# Patient Record
Sex: Female | Born: 2000 | Race: White | Hispanic: No | Marital: Single | Smoking: Never smoker
Health system: Southern US, Community
[De-identification: ages and names within clinical notes are randomized; demographics above are authoritative.]

## PROBLEM LIST (undated history)

## (undated) DIAGNOSIS — F419 Anxiety disorder, unspecified: Secondary | ICD-10-CM

## (undated) DIAGNOSIS — F32A Depression, unspecified: Secondary | ICD-10-CM

## (undated) HISTORY — PX: NO PAST SURGERIES: SHX2092

---

## 2019-10-16 ENCOUNTER — Encounter (HOSPITAL_BASED_OUTPATIENT_CLINIC_OR_DEPARTMENT_OTHER): Payer: Self-pay | Admitting: Emergency Medicine

## 2019-10-16 ENCOUNTER — Emergency Department (HOSPITAL_BASED_OUTPATIENT_CLINIC_OR_DEPARTMENT_OTHER): Payer: Medicaid Other

## 2019-10-16 ENCOUNTER — Other Ambulatory Visit: Payer: Self-pay

## 2019-10-16 ENCOUNTER — Emergency Department (HOSPITAL_BASED_OUTPATIENT_CLINIC_OR_DEPARTMENT_OTHER)
Admission: EM | Admit: 2019-10-16 | Discharge: 2019-10-16 | Disposition: A | Discharge status: Home / Self Care | Payer: Medicaid Other | Attending: Emergency Medicine | Admitting: Emergency Medicine

## 2019-10-16 DIAGNOSIS — R42 Dizziness and giddiness: Secondary | ICD-10-CM | POA: Insufficient documentation

## 2019-10-16 DIAGNOSIS — Z20822 Contact with and (suspected) exposure to covid-19: Secondary | ICD-10-CM | POA: Diagnosis not present

## 2019-10-16 DIAGNOSIS — R55 Syncope and collapse: Secondary | ICD-10-CM | POA: Diagnosis not present

## 2019-10-16 DIAGNOSIS — R002 Palpitations: Secondary | ICD-10-CM | POA: Diagnosis present

## 2019-10-16 DIAGNOSIS — H539 Unspecified visual disturbance: Secondary | ICD-10-CM | POA: Diagnosis not present

## 2019-10-16 DIAGNOSIS — E860 Dehydration: Secondary | ICD-10-CM | POA: Diagnosis not present

## 2019-10-16 DIAGNOSIS — R000 Tachycardia, unspecified: Secondary | ICD-10-CM | POA: Insufficient documentation

## 2019-10-16 LAB — COMPREHENSIVE METABOLIC PANEL
ALT: 17 U/L (ref 0–44)
AST: 32 U/L (ref 15–41)
Albumin: 4.5 g/dL (ref 3.5–5.0)
Alkaline Phosphatase: 46 U/L (ref 38–126)
Anion gap: 12 (ref 5–15)
BUN: 12 mg/dL (ref 6–20)
CO2: 22 mmol/L (ref 22–32)
Calcium: 9.1 mg/dL (ref 8.9–10.3)
Chloride: 103 mmol/L (ref 98–111)
Creatinine, Ser: 0.72 mg/dL (ref 0.44–1.00)
GFR calc non Af Amer: >60 mL/min (ref >60)
Glucose, Bld: 105 mg/dL — ABNORMAL HIGH (ref 70–99)
Potassium: 3.2 mmol/L — ABNORMAL LOW (ref 3.5–5.1)
Sodium: 137 mmol/L (ref 135–145)
Total Bilirubin: 1 mg/dL (ref 0.3–1.2)
Total Protein: 7.4 g/dL (ref 6.5–8.1)

## 2019-10-16 LAB — URINALYSIS, ROUTINE W REFLEX MICROSCOPIC
Bilirubin Urine: NEGATIVE
Glucose, UA: NEGATIVE mg/dL
Hgb urine dipstick: NEGATIVE
Ketones, ur: NEGATIVE mg/dL
Leukocytes,Ua: NEGATIVE
Nitrite: NEGATIVE
Protein, ur: NEGATIVE mg/dL
Specific Gravity, Urine: 1.015 (ref 1.005–1.030)
pH: 5.5 (ref 5.0–8.0)

## 2019-10-16 LAB — RESPIRATORY PANEL BY RT PCR (FLU A&B, COVID)
Influenza A by PCR: NEGATIVE
Influenza B by PCR: NEGATIVE
SARS Coronavirus 2 by RT PCR: NEGATIVE

## 2019-10-16 LAB — CBC WITH DIFFERENTIAL/PLATELET
Abs Immature Granulocytes: 0.05 10*3/uL (ref 0.00–0.07)
Basophils Absolute: 0.1 10*3/uL (ref 0.0–0.1)
Basophils Relative: 1 %
Eosinophils Absolute: 0.3 10*3/uL (ref 0.0–0.5)
Eosinophils Relative: 2 %
HCT: 41.2 % (ref 36.0–46.0)
Hemoglobin: 14.2 g/dL (ref 12.0–15.0)
Immature Granulocytes: 0 %
Lymphocytes Relative: 51 %
Lymphs Abs: 7.2 10*3/uL — ABNORMAL HIGH (ref 0.7–4.0)
MCH: 31.9 pg (ref 26.0–34.0)
MCHC: 34.5 g/dL (ref 30.0–36.0)
MCV: 92.6 fL (ref 80.0–100.0)
Monocytes Absolute: 1 10*3/uL (ref 0.1–1.0)
Monocytes Relative: 7 %
Neutro Abs: 5.5 10*3/uL (ref 1.7–7.7)
Neutrophils Relative %: 39 %
Platelets: 300 10*3/uL (ref 150–400)
RBC: 4.45 MIL/uL (ref 3.87–5.11)
RDW: 12 % (ref 11.5–15.5)
WBC: 14.2 10*3/uL — ABNORMAL HIGH (ref 4.0–10.5)
nRBC: 0 % (ref 0.0–0.2)

## 2019-10-16 LAB — D-DIMER, QUANTITATIVE: D-Dimer, Quant: <0.27 ug/mL-FEU (ref 0.00–0.50)

## 2019-10-16 LAB — TSH: TSH: 8.718 u[IU]/mL — ABNORMAL HIGH (ref 0.350–4.500)

## 2019-10-16 LAB — CBG MONITORING, ED: Glucose-Capillary: 96 mg/dL (ref 70–99)

## 2019-10-16 LAB — PREGNANCY, URINE: Preg Test, Ur: NEGATIVE

## 2019-10-16 MED ORDER — SODIUM CHLORIDE 0.9 % IV SOLN: INTRAVENOUS | Status: DC

## 2019-10-16 MED ORDER — SODIUM CHLORIDE 0.9 % IV BOLUS
1000.0000 mL | Freq: Once | INTRAVENOUS | Status: AC
  Administered 2019-10-16: 1000 mL via INTRAVENOUS

## 2019-10-16 NOTE — ED Notes (Signed)
Pt unable to urinate at this time.  

## 2019-10-16 NOTE — ED Provider Notes (Signed)
[facility] EMERGENCY DEPARTMENT Provider Note  CSN: [id number] Arrival date & time: 10/16/19 0134  Chief Complaint(s) Palpitations and visual changes  HPI Ana Church is a 19 y.o. female with a history of depression who presents to the emergency department with palpitations. Patient reports that she was lying in bed when the symptoms began. She noted that every few beats in her heart would thump. Then she noted a rapid heart rate.  She also felt lightheaded and saw spots. Patient then began to shake all over.  Did not lose consciousness at this time. She denies any recent fevers or infections. No coughing or congestion. No associated nausea or recent vomiting.  No diarrhea or abdominal pain. Patient is sexually active with her boyfriend who brought her in. Denies any history of thyroid issues..  Wellbeing brought to the room, patient reported feeling lightheaded like she was going to pass out.  To nursing patient appeared pale and diaphoretic.  HPI  Past Medical History History reviewed. No pertinent past medical history. There are no problems to display for this patient.  Home Medication(s) Prior to Admission medications   Not on File                                                                                                                                    Past Surgical History History reviewed. No pertinent surgical history. Family History Family History  Problem Relation Age of Onset  . Hypertension Father   . Diabetes Father     Social History Social History   Tobacco Use  . Smoking status: Never Smoker  . Smokeless tobacco: Never Used  Vaping Use  . Vaping Use: Never used  Substance Use Topics  . Alcohol use: Never  . Drug use: Never   Allergies Patient has no known allergies.  Review of Systems Review of Systems All other systems are reviewed and are negative for acute change except as noted in the HPI  Physical Exam Vital Signs    I have reviewed the triage vital signs BP 117/78 (BP Location: Right Arm)   Pulse (!) 103   Temp 98.2 F (36.8 C) (Oral)   Resp 18   Ht 5\' 4"  (1.626 m)   Wt 59 kg   LMP 10/09/2019 (Approximate)   SpO2 100%   BMI 22.31 kg/m   Physical Exam Vitals reviewed.  Constitutional:      General: She is not in acute distress.    Appearance: She is well-developed. She is not diaphoretic.  HENT:     Head: Normocephalic and atraumatic.     Nose: Nose normal.  Eyes:     General: No scleral icterus.       Right eye: No discharge.        Left eye: No discharge.     Conjunctiva/sclera: Conjunctivae normal.     Pupils: Pupils are equal, round, and reactive to light.  Cardiovascular:  Rate and Rhythm: Regular rhythm. Tachycardia present.     Heart sounds: No murmur heard.  No friction rub. No gallop.   Pulmonary:     Effort: Pulmonary effort is normal. No respiratory distress.     Breath sounds: Normal breath sounds. No stridor. No rales.  Abdominal:     General: There is no distension.     Palpations: Abdomen is soft.     Tenderness: There is no abdominal tenderness.  Musculoskeletal:        General: No tenderness.     Cervical back: Normal range of motion and neck supple.  Skin:    General: Skin is warm and dry.     Findings: No erythema or rash.  Neurological:     Mental Status: She is alert and oriented to person, place, and time.     ED Results and Treatments Labs (all labs ordered are listed, but only abnormal results are displayed) Labs Reviewed  CBC WITH DIFFERENTIAL/PLATELET - Abnormal; Notable for the following components:      Result Value   WBC 14.2 (*)    Lymphs Abs 7.2 (*)    All other components within normal limits  COMPREHENSIVE METABOLIC PANEL - Abnormal; Notable for the following components:   Potassium 3.2 (*)    Glucose, Bld 105 (*)    All other components within normal limits  TSH - Abnormal; Notable for the following components:   TSH 8.718 (*)     All other components within normal limits  RESPIRATORY PANEL BY RT PCR (FLU A&B, COVID)  PREGNANCY, URINE  URINALYSIS, ROUTINE W REFLEX MICROSCOPIC  D-DIMER, QUANTITATIVE (NOT AT [facility])  T4, FREE  PATHOLOGIST SMEAR REVIEW  CBG MONITORING, ED  CBG MONITORING, ED                                                                                                                         EKG  EKG Interpretation  Date/Time:  Wednesday October 16 2019 07:17:26 EDT Ventricular Rate:  97 PR Interval:    QRS Duration: 106 QT Interval:  359 QTC Calculation: 456 R Axis:   101 Text Interpretation: Sinus rhythm Borderline right axis deviation Low voltage, extremity and precordial leads When compared to prior, improved QTc. No STEMI Confirmed by Theda Belfast ([id number]) on 10/16/2019 9:40:44 AM      Radiology DG Chest 2 View  Result Date: 10/16/2019 CLINICAL DATA:  Tachycardia EXAM: CHEST - 2 VIEW COMPARISON:  None. FINDINGS: The heart size and mediastinal contours are within normal limits. Both lungs are clear. The visualized skeletal structures are unremarkable. IMPRESSION: No active cardiopulmonary disease. Electronically Signed   By: Jonna Clark M.D.   On: 10/16/2019 02:47    Pertinent labs & imaging results that were available during my care of the patient were reviewed by me and considered in my medical decision making (see chart for details).  Medications Ordered in ED Medications  sodium chloride 0.9 % bolus 1,000 mL (0 mLs  Intravenous Stopped 10/16/19 0340)    And  0.9 %  sodium chloride infusion ( Intravenous Stopped 10/16/19 1606)  sodium chloride 0.9 % bolus 1,000 mL (0 mLs Intravenous Stopped 10/16/19 0852)                                                                                                                                    Procedures Procedures  (including critical care time)  Medical Decision Making / ED Course I have reviewed the nursing notes for this encounter and  the patient's prior records (if available in EHR or on provided paperwork).   Ana Church was evaluated in Emergency Department on 10/16/2019 for the symptoms described in the history of present illness. She was evaluated in the context of the global COVID-19 pandemic, which necessitated consideration that the patient might be at risk for infection with the SARS-CoV-2 virus that causes COVID-19. Institutional protocols and algorithms that pertain to the evaluation of patients at risk for COVID-19 are in a state of rapid change based on information released by regulatory bodies including the CDC and federal and state organizations. These policies and algorithms were followed during the patient's care in the ED.  Patient presents with palpitations and near syncopal episode. EKG with sinus tachycardia and borderline QT prolongation.  Patient denies being on any medications.  Denies any illicit drug use or alcohol use. Patient is orthostatic with elevated heart rate upon standing. Will provide IV fluids. UPT negative.  UA without evidence of infection. CBC with leukocytosis and elevated lymphocyte counts which morphologically appear to be normal/reactive. No symptoms concerning for viral process. No evidence of infection on exam. Covid/influenza negative.  Metabolic panel with mild hypokalemia otherwise no significant electrolyte derangements or renal sufficiency.  Check in thyroid panel to assess for possible hyperthyroidism.  Still pending.  Dimer obtained to rule out PE and negative.  PE unlikely.  After liter half of IV fluids, patient was reassessed and still had tachycardia to the 130s upon standing.  Will provide with additional fluid hydration.  Patient care turned over to Dr Rush Landmark. Patient case and results discussed in detail; please see their note for further ED managment.      Final Clinical Impression(s) / ED Diagnoses Final diagnoses:  Palpitations  Intermittent  lightheadedness  Tachycardia  Dehydration      This chart was dictated using voice recognition software.  Despite best efforts to proofread,  errors can occur which can change the documentation meaning.   Nira Conn, MD 10/16/19 2107

## 2019-10-16 NOTE — ED Notes (Signed)
ED Provider at bedside. 

## 2019-10-16 NOTE — ED Triage Notes (Signed)
Pt brought in by her boyfriend  Pt states she was asleep and woke up with her heart racing and states she started shaking all over  Pt states on the way here she felt like she was going to pass out and became diaphoretic  Pt alert and oriented in triage   Pt is diaphoretic and pale

## 2019-10-16 NOTE — ED Notes (Signed)
Reviewed D/C papers, pt denies questions at this time.

## 2019-10-16 NOTE — Discharge Instructions (Signed)
Your work-up today was overall reassuring.  Your TSH was slightly elevated discussed, please follow-up with your PCP for this.  For the recurrent palpitations and fast heart rate, if it does not continue to improve, please follow-up with cardiology for further evaluation and management.  Your other work-up is reassuring and we do not see evidence of a blood clot, pneumonia, your Covid test was negative, and your other labs are reassuring.  Please rest and stay hydrated.  If any symptoms change or worsen, please return to the nearest emergency department.

## 2019-10-16 NOTE — ED Provider Notes (Signed)
7:26 AM Care assumed for Dr. Eudelia Bunch.  At time of transfer of care, patient is awaiting reassessment after further rehydration and checking of orthostatics.  Patient had a syncopal episode as well as palpitations and tachycardia likely related to dehydration.  Patient had work-up showing no evidence of infection in her urine or chest x-ray.  Covid test has been ordered.  Patient is a mild leukocytosis but otherwise did not have significant anemia or elevated D-dimer.  Patient is scheduled to follow-up with her PCP soon.  Plan of care with a discharge if she is feeling better and heart rate improves after fluids.  Patient received fluids and still had some intermittent tachycardia with standing.  However she had no further lightheadedness, palpitations or other symptoms.  She denies any syncope.  She was not hypotensive further.  Her work-up still was reassuring with no evidence of UTI, Covid, or PE with lack of elevation in D-dimer.  TSH was low elevated suggestive of a hypothyroidism instead of hyperthyroidism.  Given the fast heart rate with palpitations this was felt to be less likely.  The T4 still in process and she will follow up with her PCP for recheck and further evaluation of her thyroid.  We offered further fluids and continued reassessment orthostatics after more fluids however she would rather go home.  Her EKG did not show A. fib on reassessment.  QTc had improved.  Given otherwise well appearance and improved symptoms after rehydration, feel she safe for discharge home for PCP follow-up this week.  She agrees with plan of care and was discharged in good condition after p.o. challenge.  Clinical Impression: 1. Palpitations   2. Intermittent lightheadedness   3. Tachycardia   4. Dehydration     Disposition: Discharge  Condition: Good  I have discussed the results, Dx and Tx plan with the pt(& family if present). He/she/they expressed understanding and agree(s) with the plan.  Discharge instructions discussed at great length. Strict return precautions discussed and pt &/or family have verbalized understanding of the instructions. No further questions at time of discharge.    New Prescriptions   No medications on file    Follow Up: [facility] [address] [place] [address] [phone] Schedule an appointment as soon as possible for a visit    [facility] [address] [phone] mc [address] [place] [address] [phone]    [facility] CARDIOVASCULAR DIVISION [address] [place] [address] [phone]           Sayvion Vigen, Canary Brim, MD 10/16/19 1540

## 2019-10-17 LAB — T4, FREE: Free T4: 0.97 ng/dL (ref 0.61–1.12)

## 2019-10-17 LAB — PATHOLOGIST SMEAR REVIEW

## 2020-11-10 ENCOUNTER — Other Ambulatory Visit: Payer: Self-pay

## 2020-11-10 ENCOUNTER — Encounter (HOSPITAL_BASED_OUTPATIENT_CLINIC_OR_DEPARTMENT_OTHER): Payer: Self-pay | Admitting: *Deleted

## 2020-11-10 ENCOUNTER — Emergency Department (HOSPITAL_BASED_OUTPATIENT_CLINIC_OR_DEPARTMENT_OTHER)
Admission: EM | Admit: 2020-11-10 | Discharge: 2020-11-10 | Disposition: A | Discharge status: Home / Self Care | Payer: Medicaid Other | Attending: Emergency Medicine | Admitting: Emergency Medicine

## 2020-11-10 DIAGNOSIS — Z5321 Procedure and treatment not carried out due to patient leaving prior to being seen by health care provider: Secondary | ICD-10-CM | POA: Diagnosis not present

## 2020-11-10 DIAGNOSIS — R202 Paresthesia of skin: Secondary | ICD-10-CM | POA: Diagnosis not present

## 2020-11-10 DIAGNOSIS — R42 Dizziness and giddiness: Secondary | ICD-10-CM | POA: Insufficient documentation

## 2020-11-10 DIAGNOSIS — R002 Palpitations: Secondary | ICD-10-CM | POA: Insufficient documentation

## 2020-11-10 DIAGNOSIS — R519 Headache, unspecified: Secondary | ICD-10-CM | POA: Diagnosis not present

## 2020-11-10 HISTORY — DX: Depression, unspecified: F32.A

## 2020-11-10 HISTORY — DX: Anxiety disorder, unspecified: F41.9

## 2020-11-10 NOTE — ED Triage Notes (Signed)
She woke with feeling of her heart being forced to beat. Palpitations that are painful. Through out the day she has had dizziness, tingling, head pressure. EKG at triage.

## 2021-03-09 ENCOUNTER — Other Ambulatory Visit: Payer: Self-pay

## 2021-03-09 ENCOUNTER — Emergency Department (HOSPITAL_BASED_OUTPATIENT_CLINIC_OR_DEPARTMENT_OTHER)
Admission: EM | Admit: 2021-03-09 | Discharge: 2021-03-09 | Disposition: A | Discharge status: Home / Self Care | Payer: Medicaid Other | Attending: Emergency Medicine | Admitting: Emergency Medicine

## 2021-03-09 ENCOUNTER — Encounter (HOSPITAL_BASED_OUTPATIENT_CLINIC_OR_DEPARTMENT_OTHER): Payer: Self-pay

## 2021-03-09 DIAGNOSIS — E871 Hypo-osmolality and hyponatremia: Secondary | ICD-10-CM | POA: Insufficient documentation

## 2021-03-09 DIAGNOSIS — R112 Nausea with vomiting, unspecified: Secondary | ICD-10-CM | POA: Insufficient documentation

## 2021-03-09 DIAGNOSIS — Z20822 Contact with and (suspected) exposure to covid-19: Secondary | ICD-10-CM | POA: Diagnosis not present

## 2021-03-09 DIAGNOSIS — R1033 Periumbilical pain: Secondary | ICD-10-CM | POA: Insufficient documentation

## 2021-03-09 DIAGNOSIS — R197 Diarrhea, unspecified: Secondary | ICD-10-CM | POA: Insufficient documentation

## 2021-03-09 DIAGNOSIS — K529 Noninfective gastroenteritis and colitis, unspecified: Secondary | ICD-10-CM

## 2021-03-09 LAB — CBC
HCT: 44.6 % (ref 36.0–46.0)
Hemoglobin: 15.3 g/dL — ABNORMAL HIGH (ref 12.0–15.0)
MCH: 32.6 pg (ref 26.0–34.0)
MCHC: 34.3 g/dL (ref 30.0–36.0)
MCV: 94.9 fL (ref 80.0–100.0)
Platelets: 236 10*3/uL (ref 150–400)
RBC: 4.7 MIL/uL (ref 3.87–5.11)
RDW: 12.3 % (ref 11.5–15.5)
WBC: 11.3 10*3/uL — ABNORMAL HIGH (ref 4.0–10.5)
nRBC: 0 % (ref 0.0–0.2)

## 2021-03-09 LAB — URINALYSIS, ROUTINE W REFLEX MICROSCOPIC
Bilirubin Urine: NEGATIVE
Glucose, UA: NEGATIVE mg/dL
Ketones, ur: NEGATIVE mg/dL
Leukocytes,Ua: NEGATIVE
Nitrite: NEGATIVE
Protein, ur: NEGATIVE mg/dL
Specific Gravity, Urine: >=1.03 (ref 1.005–1.030)
pH: 5.5 (ref 5.0–8.0)

## 2021-03-09 LAB — COMPREHENSIVE METABOLIC PANEL
ALT: 17 U/L (ref 0–44)
AST: 32 U/L (ref 15–41)
Albumin: 4.3 g/dL (ref 3.5–5.0)
Alkaline Phosphatase: 55 U/L (ref 38–126)
Anion gap: 9 (ref 5–15)
BUN: 8 mg/dL (ref 6–20)
CO2: 24 mmol/L (ref 22–32)
Calcium: 8.8 mg/dL — ABNORMAL LOW (ref 8.9–10.3)
Chloride: 100 mmol/L (ref 98–111)
Creatinine, Ser: 0.74 mg/dL (ref 0.44–1.00)
GFR, Estimated: >60 mL/min (ref >60)
Glucose, Bld: 108 mg/dL — ABNORMAL HIGH (ref 70–99)
Potassium: 3.2 mmol/L — ABNORMAL LOW (ref 3.5–5.1)
Sodium: 133 mmol/L — ABNORMAL LOW (ref 135–145)
Total Bilirubin: 0.9 mg/dL (ref 0.3–1.2)
Total Protein: 8 g/dL (ref 6.5–8.1)

## 2021-03-09 LAB — URINALYSIS, MICROSCOPIC (REFLEX): WBC, UA: NONE SEEN WBC/hpf (ref 0–5)

## 2021-03-09 LAB — RESP PANEL BY RT-PCR (FLU A&B, COVID) ARPGX2
Influenza A by PCR: NEGATIVE
Influenza B by PCR: NEGATIVE
SARS Coronavirus 2 by RT PCR: NEGATIVE

## 2021-03-09 LAB — LIPASE, BLOOD: Lipase: 38 U/L (ref 11–51)

## 2021-03-09 LAB — PREGNANCY, URINE: Preg Test, Ur: NEGATIVE

## 2021-03-09 MED ORDER — POTASSIUM CHLORIDE 20 MEQ PO PACK: 40.0000 meq | PACK | Freq: Two times a day (BID) | ORAL | Status: DC

## 2021-03-09 MED ORDER — ONDANSETRON 4 MG PO TBDP: 4.0000 mg | ORAL_TABLET | Freq: Three times a day (TID) | ORAL | 0 refills | Status: DC | PRN

## 2021-03-09 MED ORDER — POTASSIUM CHLORIDE CRYS ER 20 MEQ PO TBCR: 40.0000 meq | EXTENDED_RELEASE_TABLET | Freq: Once | ORAL | Status: DC

## 2021-03-09 MED ORDER — ONDANSETRON 4 MG PO TBDP
8.0000 mg | ORAL_TABLET | Freq: Once | ORAL | Status: AC
  Administered 2021-03-09: 8 mg via ORAL
  Filled 2021-03-09: qty 2

## 2021-03-09 MED ORDER — SODIUM CHLORIDE 0.9 % IV BOLUS
1000.0000 mL | Freq: Once | INTRAVENOUS | Status: AC
  Administered 2021-03-09: 1000 mL via INTRAVENOUS

## 2021-03-09 NOTE — Discharge Instructions (Addendum)
Stay well hydrated, take frequent sips- every 15 minutes. You may use zofran for nausea and vomiting once every 8 hours. I recommend using drinks like pedialyte or gatorade that have electrolytes in them to stay hydrated.   If you have a fever above 100.4*F that is not responsive to treatment like tylenol, you cannot keep fluids down even with zofran, you have terrible abdominal pain that does not improve, please return to be evaluated.  Follow up in a few days with your primary care doctor if not improved.

## 2021-03-09 NOTE — ED Notes (Signed)
Pt A&OX4 ambulatory at d/c with independent steady gait, NAD. Pt verbalized understanding of d/c instructions, prescription and follow up care. 

## 2021-03-09 NOTE — ED Provider Notes (Signed)
[facility] EMERGENCY DEPARTMENT Provider Note   CSN: [id number] Arrival date & time: 03/09/21  1508     History  Chief Complaint  Patient presents with   Abdominal Pain    Ana Church is a [age] y.o. female.  21 yo woman presents with 36 hours of nausea, emesis, bloody diarrhea, body aches, abdominal pain and mildly elevated temperature. She reports yesterday she started feeling poorly with body aches and chills, and developed n/v/d. She had an elevated temp to 99.9*F, no true fever. She had a couple episodes of emesis yesterday (none today), 4 episodes of diarrhea today, and is able to tolerate fluids. She reports that the diarrhea had bright red blood mixed in, no clots. Her abdominal pain is dull, achy, and periumbilical. She went to an urgent care and they recommended she come to the ED. Her PMH is only notable for SVT and anxiety, she takes fluoxetine. She has never had abdominal surgery. No family hx of IBD.      Home Medications Prior to Admission medications   Medication Sig Start Date End Date Taking? Authorizing Provider  FLUoxetine (PROZAC) 10 MG capsule Take by mouth.    [provider]      Allergies    Patient has no known allergies.    Review of Systems   Review of Systems  Constitutional:  Positive for chills. Negative for fever.  HENT:  Negative for rhinorrhea, sinus pain and sore throat.   Respiratory:  Negative for cough, shortness of breath and wheezing.   Cardiovascular:  Negative for chest pain, palpitations and leg swelling.  Gastrointestinal:  Positive for abdominal pain, diarrhea, nausea and vomiting. Negative for constipation.  Genitourinary:  Negative for dysuria, flank pain, frequency and urgency.  Musculoskeletal:  Positive for myalgias. Negative for back pain.  Skin:  Positive for pallor. Negative for color change.  Neurological:  Negative for dizziness, weakness and headaches.  Psychiatric/Behavioral:  Negative for  confusion.    Physical Exam Updated Vital Signs BP 110/72    Pulse (!) 110    Temp 98.1 F (36.7 C) (Oral)    Resp (!) 21    Ht 5\' 4"  (1.626 m)    Wt 61.7 kg    LMP 02/25/2021 (Approximate)    SpO2 99%    BMI 23.34 kg/m  Physical Exam Vitals and nursing note reviewed.  Constitutional:      General: She is not in acute distress.    Appearance: She is normal weight. She is ill-appearing. She is not toxic-appearing or diaphoretic.  HENT:     Head: Normocephalic and atraumatic.     Mouth/Throat:     Mouth: Mucous membranes are moist.     Pharynx: Oropharynx is clear.  Eyes:     General: No scleral icterus.    Pupils: Pupils are equal, round, and reactive to light.  Cardiovascular:     Rate and Rhythm: Regular rhythm. Tachycardia present.     Heart sounds: Normal heart sounds. No murmur heard.   No friction rub. No gallop.  Pulmonary:     Effort: Pulmonary effort is normal.     Breath sounds: Normal breath sounds.  Abdominal:     General: Abdomen is flat. Bowel sounds are normal. There is no distension or abdominal bruit. There are no signs of injury.     Palpations: Abdomen is soft. There is no hepatomegaly, splenomegaly or mass.     Tenderness: There is abdominal tenderness in the periumbilical area. There is  no right CVA tenderness, left CVA tenderness, guarding or rebound. Negative signs include McBurney's sign.     Hernia: No hernia is present.  Skin:    General: Skin is warm and dry.     Capillary Refill: Capillary refill takes less than 2 seconds.     Coloration: Skin is pale.  Neurological:     General: No focal deficit present.     Mental Status: She is alert and oriented to person, place, and time.  Psychiatric:        Mood and Affect: Mood normal.        Behavior: Behavior normal.    ED Results / Procedures / Treatments   Labs (all labs ordered are listed, but only abnormal results are displayed) Labs Reviewed  CBC - Abnormal; Notable for the following  components:      Result Value   WBC 11.3 (*)    Hemoglobin 15.3 (*)    All other components within normal limits  RESP PANEL BY RT-PCR (FLU A&B, COVID) ARPGX2  PREGNANCY, URINE  LIPASE, BLOOD  COMPREHENSIVE METABOLIC PANEL  URINALYSIS, ROUTINE W REFLEX MICROSCOPIC    EKG EKG Interpretation  Date/Time:  Tuesday March 09 2021 15:50:25 EST Ventricular Rate:  106 PR Interval:  141 QRS Duration: 109 QT Interval:  340 QTC Calculation: 452 R Axis:   117 Text Interpretation: Sinus tachycardia Right axis deviation Low voltage, precordial leads No significant change since last tracing Confirmed by Gareth Morgan [phone]) on 03/09/2021 3:53:32 PM  Radiology No results found.  Procedures Procedures    Medications Ordered in ED Medications  ondansetron (ZOFRAN-ODT) disintegrating tablet 8 mg (8 mg Oral Given 03/09/21 1604)  sodium chloride 0.9 % bolus 1,000 mL (1,000 mLs Intravenous New Bag/Given 03/09/21 1602)    ED Course/ Medical Decision Making/ A&P Clinical Course as of 03/09/21 1722  Tue Mar 09, 2021  1634 Potassium low to 3.2, will replete with 40 mEq klor con packet. Mild hyponatremia to 133, will treat with NS bolus.  [CM]  1709 Tachycardia resolved with fluids.  [CM]  1709 Some hematuria, though likely contaminant. Patient's menses ended 1.5 weeks ago. Discussed that this is most likely a gastroenteritis and recommend supportive care. Patient was agreeable to this plan. As she is young, otherwise healthy, does not have severe dehydration, <6 stools per day and hgb stable, recommend not treating with abx. Will send home with zofran, strict return precautions, follow up with primary care physician. [CM]    Clinical Course User Index [CM] Ana Damme, MD                           Medical Decision Making 21 yo woman presents with 36 hours of abdominal pain with infectious symptoms. Ddx broad, but most likely to be gastroenteritis given abdominal pain, n/v/d. Also on  ddx is IBD, pancreatitis, appendicitis, nephrolithiasis, ovarian torsion or PID, however given benign abdominal exam, no urinary sx, no pelvic pain, these are all less likely. Vital signs are only notable for mild tachycardia to 110s. Will obtain basic CBC, CMP, lipase, ua, ekg, urine pregnancy test. Will treat with zofran, IVF and re-evaluate.  Amount and/or Complexity of Data Reviewed Labs: ordered. ECG/medicine tests: independent interpretation performed.    Details: sinus tachycardia  Risk Prescription drug management.            Final Clinical Impression(s) / ED Diagnoses Final diagnoses:  None    Rx / DC Orders ED  Discharge Orders     None         Ana Damme, MD 03/09/21 [id number]    Gareth Morgan, MD 03/10/21 1444

## 2021-03-09 NOTE — ED Triage Notes (Signed)
Pt c/o abd pain, n/v, bloody diarrhea started yesterday-NAD-steady gait

## 2021-11-18 ENCOUNTER — Encounter (HOSPITAL_COMMUNITY): Payer: Self-pay | Admitting: Nurse Practitioner

## 2021-11-18 ENCOUNTER — Emergency Department (HOSPITAL_COMMUNITY)
Admission: EM | Admit: 2021-11-18 | Discharge: 2021-11-18 | Disposition: A | Discharge status: Other Acute Inpatient Hospital | Payer: Medicaid Other | Source: Home / Self Care | Attending: Emergency Medicine | Admitting: Emergency Medicine

## 2021-11-18 ENCOUNTER — Other Ambulatory Visit: Payer: Self-pay

## 2021-11-18 ENCOUNTER — Inpatient Hospital Stay (HOSPITAL_COMMUNITY)
Admission: AD | Admit: 2021-11-18 | Discharge: 2021-11-21 | DRG: 885 | Disposition: A | Discharge status: Home / Self Care | Payer: Medicaid Other | Source: Intra-hospital | Attending: Psychiatry | Admitting: Psychiatry

## 2021-11-18 ENCOUNTER — Encounter (HOSPITAL_COMMUNITY): Payer: Self-pay | Admitting: *Deleted

## 2021-11-18 DIAGNOSIS — Z1152 Encounter for screening for COVID-19: Secondary | ICD-10-CM

## 2021-11-18 DIAGNOSIS — Z20822 Contact with and (suspected) exposure to covid-19: Secondary | ICD-10-CM | POA: Insufficient documentation

## 2021-11-18 DIAGNOSIS — F1090 Alcohol use, unspecified, uncomplicated: Secondary | ICD-10-CM | POA: Diagnosis present

## 2021-11-18 DIAGNOSIS — F419 Anxiety disorder, unspecified: Secondary | ICD-10-CM | POA: Diagnosis present

## 2021-11-18 DIAGNOSIS — R45851 Suicidal ideations: Secondary | ICD-10-CM | POA: Diagnosis present

## 2021-11-18 DIAGNOSIS — Y905 Blood alcohol level of 100-119 mg/100 ml: Secondary | ICD-10-CM | POA: Insufficient documentation

## 2021-11-18 DIAGNOSIS — F332 Major depressive disorder, recurrent severe without psychotic features: Secondary | ICD-10-CM | POA: Insufficient documentation

## 2021-11-18 DIAGNOSIS — J029 Acute pharyngitis, unspecified: Secondary | ICD-10-CM | POA: Diagnosis present

## 2021-11-18 DIAGNOSIS — F1010 Alcohol abuse, uncomplicated: Secondary | ICD-10-CM | POA: Diagnosis not present

## 2021-11-18 DIAGNOSIS — E559 Vitamin D deficiency, unspecified: Secondary | ICD-10-CM | POA: Diagnosis present

## 2021-11-18 LAB — COMPREHENSIVE METABOLIC PANEL
ALT: 18 U/L (ref 0–44)
AST: 37 U/L (ref 15–41)
Albumin: 4.2 g/dL (ref 3.5–5.0)
Alkaline Phosphatase: 51 U/L (ref 38–126)
Anion gap: 8 (ref 5–15)
BUN: 10 mg/dL (ref 6–20)
CO2: 23 mmol/L (ref 22–32)
Calcium: 8.9 mg/dL (ref 8.9–10.3)
Chloride: 108 mmol/L (ref 98–111)
Creatinine, Ser: 0.59 mg/dL (ref 0.44–1.00)
GFR, Estimated: >60 mL/min (ref >60)
Glucose, Bld: 90 mg/dL (ref 70–99)
Potassium: 3.8 mmol/L (ref 3.5–5.1)
Sodium: 139 mmol/L (ref 135–145)
Total Bilirubin: 0.6 mg/dL (ref 0.3–1.2)
Total Protein: 7.5 g/dL (ref 6.5–8.1)

## 2021-11-18 LAB — RAPID URINE DRUG SCREEN, HOSP PERFORMED
Amphetamines: NOT DETECTED
Barbiturates: NOT DETECTED
Benzodiazepines: NOT DETECTED
Cocaine: NOT DETECTED
Opiates: NOT DETECTED
Tetrahydrocannabinol: NOT DETECTED

## 2021-11-18 LAB — RESP PANEL BY RT-PCR (FLU A&B, COVID) ARPGX2
Influenza A by PCR: NEGATIVE
Influenza B by PCR: NEGATIVE
SARS Coronavirus 2 by RT PCR: NEGATIVE

## 2021-11-18 LAB — CBC WITH DIFFERENTIAL/PLATELET
Abs Immature Granulocytes: 0.02 10*3/uL (ref 0.00–0.07)
Basophils Absolute: 0.1 10*3/uL (ref 0.0–0.1)
Basophils Relative: 1 %
Eosinophils Absolute: 0.1 10*3/uL (ref 0.0–0.5)
Eosinophils Relative: 2 %
HCT: 38.4 % (ref 36.0–46.0)
Hemoglobin: 13 g/dL (ref 12.0–15.0)
Immature Granulocytes: 0 %
Lymphocytes Relative: 46 %
Lymphs Abs: 3.8 10*3/uL (ref 0.7–4.0)
MCH: 32.7 pg (ref 26.0–34.0)
MCHC: 33.9 g/dL (ref 30.0–36.0)
MCV: 96.7 fL (ref 80.0–100.0)
Monocytes Absolute: 0.6 10*3/uL (ref 0.1–1.0)
Monocytes Relative: 7 %
Neutro Abs: 3.6 10*3/uL (ref 1.7–7.7)
Neutrophils Relative %: 44 %
Platelets: 276 10*3/uL (ref 150–400)
RBC: 3.97 MIL/uL (ref 3.87–5.11)
RDW: 12.3 % (ref 11.5–15.5)
WBC: 8.2 10*3/uL (ref 4.0–10.5)
nRBC: 0 % (ref 0.0–0.2)

## 2021-11-18 LAB — I-STAT BETA HCG BLOOD, ED (MC, WL, AP ONLY): I-stat hCG, quantitative: <5 m[IU]/mL (ref <5)

## 2021-11-18 LAB — ETHANOL: Alcohol, Ethyl (B): 100 mg/dL — ABNORMAL HIGH (ref <10)

## 2021-11-18 LAB — SALICYLATE LEVEL: Salicylate Lvl: <7 mg/dL — ABNORMAL LOW (ref 7.0–30.0)

## 2021-11-18 LAB — ACETAMINOPHEN LEVEL: Acetaminophen (Tylenol), Serum: <10 ug/mL — ABNORMAL LOW (ref 10–30)

## 2021-11-18 MED ORDER — FLUOXETINE HCL 10 MG PO CAPS
10.0000 mg | ORAL_CAPSULE | Freq: Every day | ORAL | Status: DC
  Administered 2021-11-18: 10 mg via ORAL
  Filled 2021-11-18: qty 1

## 2021-11-18 MED ORDER — MAGNESIUM HYDROXIDE 400 MG/5ML PO SUSP: 30.0000 mL | Freq: Every day | ORAL | Status: DC | PRN

## 2021-11-18 MED ORDER — ACETAMINOPHEN 325 MG PO TABS
650.0000 mg | ORAL_TABLET | Freq: Four times a day (QID) | ORAL | Status: DC | PRN
  Administered 2021-11-19: 650 mg via ORAL
  Filled 2021-11-18: qty 2

## 2021-11-18 MED ORDER — FLUOXETINE HCL 10 MG PO CAPS
10.0000 mg | ORAL_CAPSULE | Freq: Every day | ORAL | Status: DC
  Administered 2021-11-19: 10 mg via ORAL
  Filled 2021-11-18 (×2): qty 1

## 2021-11-18 MED ORDER — HYDROXYZINE HCL 25 MG PO TABS: 25.0000 mg | ORAL_TABLET | Freq: Three times a day (TID) | ORAL | Status: DC | PRN

## 2021-11-18 MED ORDER — ALUM & MAG HYDROXIDE-SIMETH 200-200-20 MG/5ML PO SUSP: 30.0000 mL | ORAL | Status: DC | PRN

## 2021-11-18 MED ORDER — TRAZODONE HCL 50 MG PO TABS
50.0000 mg | ORAL_TABLET | Freq: Every evening | ORAL | Status: DC | PRN
  Filled 2021-11-18: qty 1

## 2021-11-18 NOTE — Progress Notes (Signed)
Adult Psychoeducational Group Note  Date:  11/18/2021 Time:  9:19 PM  Group Topic/Focus:  Wrap-Up Group:   The focus of this group is to help patients review their daily goal of treatment and discuss progress on daily workbooks.  Participation Level:  Active  Participation Quality:  Appropriate  Affect:  Appropriate  Cognitive:  Appropriate  Insight: Appropriate  Engagement in Group:  Engaged  Modes of Intervention:  Education and Exploration  Additional Comments:  Patient attended and participated in group tonight. She reports that she has learn to make the best of her circumstances.  Lita Mains [facility] 11/18/2021, 9:19 PM

## 2021-11-18 NOTE — Progress Notes (Signed)
Patient signed a 72 hour request for discharge at 1635 11/18/21

## 2021-11-18 NOTE — Progress Notes (Signed)
Pt was accepted to [facility] TODAY 11/18/21; Bed Assignment 304-1  Pt meets inpatient criteria per Nira Conn, NP  Attending Physician will be Dr. Sherron Flemings  Report can be called to: Adult unit: [phone]  Pt can arrive after 1:00pm  Care Team notified: [facility] [place] [facility] Brook McNichol, Nira Conn, NP, Hyman Bower, RN, I-Li Howell Rucks, RN  [place], LCSWA 11/18/2021 @ 12:02 PM

## 2021-11-18 NOTE — ED Notes (Signed)
[facility] notified to take pt to [facility]

## 2021-11-18 NOTE — ED Notes (Signed)
Attempted to call report to [facility], no answer at this time. Awaiting call back

## 2021-11-18 NOTE — ED Notes (Signed)
I gave patient her breakfast tray 

## 2021-11-18 NOTE — ED Notes (Signed)
Pt belongings have been placed behind triage. Pt has shirt, pants, saddles, cell-phone, purse, and wallet (with credit cards and 30 dollars). Pt has been changed into purple scrubs.

## 2021-11-18 NOTE — Tx Team (Signed)
Initial Treatment Plan 11/18/2021 5:45 PM Ana Church [id number]    PATIENT STRESSORS: Other: PTSD     PATIENT STRENGTHS: Average or above average intelligence  Capable of independent living  Communication skills  Motivation for treatment/growth  Physical Health  Supportive family/friends    PATIENT IDENTIFIED PROBLEMS: Suicidal ideation - no plan    PTSD    "Bad nightmares"             DISCHARGE CRITERIA:  Improved stabilization in mood, thinking, and/or behavior Reduction of life-threatening or endangering symptoms to within safe limits Verbal commitment to aftercare and medication compliance  PRELIMINARY DISCHARGE PLAN: Outpatient therapy Return to previous living arrangement Return to previous work or school arrangements  PATIENT/FAMILY INVOLVEMENT: This treatment plan has been presented to and reviewed with the patient, Ana Church,  The patient has been given the opportunity to ask questions and make suggestions.  Shela Nevin, RN 11/18/2021, 5:45 PM
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
## 2021-11-18 NOTE — ED Provider Notes (Signed)
[facility]-EMERGENCY DEPT Provider Note   CSN: [id number] Arrival date & time: 11/18/21  0004     History  Chief Complaint  Patient presents with   Suicidal    Ana Church is a 21 y.o. female.  Patient presents to the emergency department for evaluation of increased anxiety and depression.  Patient has been having thoughts about harming herself by cutting.        Home Medications Prior to Admission medications   Medication Sig Start Date End Date Taking? Authorizing Provider  FLUoxetine (PROZAC) 10 MG capsule Take by mouth.    [provider]  ondansetron (ZOFRAN-ODT) 4 MG disintegrating tablet Take 1 tablet (4 mg total) by mouth every 8 (eight) hours as needed for nausea or vomiting. 03/09/21   Shirlean Mylar, MD      Allergies    Patient has no known allergies.    Review of Systems   Review of Systems  Physical Exam Updated Vital Signs BP 105/73 (BP Location: Left Arm)   Pulse 94   Temp 97.9 F (36.6 C) (Oral)   Resp 18   LMP 11/01/2021   SpO2 100%  Physical Exam Vitals and nursing note reviewed.  Constitutional:      General: She is not in acute distress.    Appearance: She is well-developed.  HENT:     Head: Normocephalic and atraumatic.     Mouth/Throat:     Mouth: Mucous membranes are moist.  Eyes:     General: Vision grossly intact. Gaze aligned appropriately.     Extraocular Movements: Extraocular movements intact.     Conjunctiva/sclera: Conjunctivae normal.  Cardiovascular:     Rate and Rhythm: Normal rate and regular rhythm.     Pulses: Normal pulses.     Heart sounds: Normal heart sounds, S1 normal and S2 normal. No murmur heard.    No friction rub. No gallop.  Pulmonary:     Effort: Pulmonary effort is normal. No respiratory distress.     Breath sounds: Normal breath sounds.  Abdominal:     General: Bowel sounds are normal.     Palpations: Abdomen is soft.     Tenderness: There is no abdominal  tenderness. There is no guarding or rebound.     Hernia: No hernia is present.  Musculoskeletal:        General: No swelling.     Cervical back: Full passive range of motion without pain, normal range of motion and neck supple. No spinous process tenderness or muscular tenderness. Normal range of motion.     Right lower leg: No edema.     Left lower leg: No edema.  Skin:    General: Skin is warm and dry.     Capillary Refill: Capillary refill takes less than 2 seconds.     Findings: No ecchymosis, erythema, rash or wound.  Neurological:     General: No focal deficit present.     Mental Status: She is alert and oriented to person, place, and time.     GCS: GCS eye subscore is 4. GCS verbal subscore is 5. GCS motor subscore is 6.     Cranial Nerves: Cranial nerves 2-12 are intact.     Sensory: Sensation is intact.     Motor: Motor function is intact.     Coordination: Coordination is intact.  Psychiatric:        Attention and Perception: Attention normal.        Mood and Affect: Mood is  anxious and depressed. Affect is tearful.        Speech: Speech normal.        Behavior: Behavior normal.        Thought Content: Thought content includes suicidal ideation.     ED Results / Procedures / Treatments   Labs (all labs ordered are listed, but only abnormal results are displayed) Labs Reviewed  ACETAMINOPHEN LEVEL - Abnormal; Notable for the following components:      Result Value   Acetaminophen (Tylenol), Serum <10 (*)    All other components within normal limits  SALICYLATE LEVEL - Abnormal; Notable for the following components:   Salicylate Lvl <7.0 (*)    All other components within normal limits  ETHANOL - Abnormal; Notable for the following components:   Alcohol, Ethyl (B) 100 (*)    All other components within normal limits  CBC WITH DIFFERENTIAL/PLATELET  COMPREHENSIVE METABOLIC PANEL  RAPID URINE DRUG SCREEN, HOSP PERFORMED  I-STAT BETA HCG BLOOD, ED ([facility], [facility], [facility] ONLY)     EKG None  Radiology No results found.  Procedures Procedures    Medications Ordered in ED Medications - No data to display  ED Course/ Medical Decision Making/ A&P                           Medical Decision Making Amount and/or Complexity of Data Reviewed Labs: ordered.   Patient presents for evaluation of worsening anxiety and depression with suicidal ideation.  Will require TTS evaluation to determine level of care need for her suicidal ideation.  Medically clear for psychiatric evaluation and treatment.        Final Clinical Impression(s) / ED Diagnoses Final diagnoses:  Suicidal ideation    Rx / DC Orders ED Discharge Orders     None         Icker Swigert, Canary Brim, MD 11/18/21 [phone]

## 2021-11-18 NOTE — Progress Notes (Signed)
Patient is a 21 year old female who presented under voluntary admission from [facility]. Pt had called the counseling crisis line with complaints of SI (without a plan), and then was encouraged to go to the ED. Pt has had no prior SA attempts, but does have a hx of cutting, as evidenced by old scars bilateral arms and hips. Pt presented anxious, and was tearful when told that she couldn't be discharged today. Pt is in her senior year attending [employer], and expressed concern over missing classes. Pt currently denies SI/HI and A/VH. Pt answered questions logically and coherently during admission interview and assessment. Admission paperwork completed and signed. Verbal understanding expressed.  VS monitored and recorded. Skin check performed with another RN.Belongings searched and secured in locker. . Patient was oriented to unit and schedule. PO fluids provided. Q 15 min checks initiated for safety.

## 2021-11-18 NOTE — ED Triage Notes (Signed)
Pt states that she has been having thoughts of hurting herself, including cutting. Hx of cutting. Taking medications as prescribed.

## 2021-11-18 NOTE — ED Notes (Signed)
Pt has on belongings bag moved to cabinets labeled Margo Aye B

## 2021-11-19 ENCOUNTER — Encounter (HOSPITAL_COMMUNITY): Payer: Self-pay

## 2021-11-19 DIAGNOSIS — F332 Major depressive disorder, recurrent severe without psychotic features: Principal | ICD-10-CM

## 2021-11-19 DIAGNOSIS — F1010 Alcohol abuse, uncomplicated: Secondary | ICD-10-CM | POA: Diagnosis present

## 2021-11-19 LAB — LIPID PANEL
Cholesterol: 163 mg/dL (ref 0–200)
HDL: 59 mg/dL (ref >40)
LDL Cholesterol: 88 mg/dL (ref 0–99)
Total CHOL/HDL Ratio: 2.8 RATIO
Triglycerides: 78 mg/dL (ref <150)
VLDL: 16 mg/dL (ref 0–40)

## 2021-11-19 LAB — VITAMIN D 25 HYDROXY (VIT D DEFICIENCY, FRACTURES): Vit D, 25-Hydroxy: 27.41 ng/mL — ABNORMAL LOW (ref 30–100)

## 2021-11-19 LAB — FERRITIN: Ferritin: 36 ng/mL (ref 11–307)

## 2021-11-19 LAB — TSH: TSH: 1.815 u[IU]/mL (ref 0.350–4.500)

## 2021-11-19 LAB — HEMOGLOBIN A1C
Hgb A1c MFr Bld: 5 % (ref 4.8–5.6)
Mean Plasma Glucose: 96.8 mg/dL

## 2021-11-19 MED ORDER — ONDANSETRON 4 MG PO TBDP: 4.0000 mg | ORAL_TABLET | Freq: Three times a day (TID) | ORAL | Status: DC | PRN

## 2021-11-19 MED ORDER — FLUOXETINE HCL 20 MG PO CAPS
20.0000 mg | ORAL_CAPSULE | Freq: Every day | ORAL | Status: AC
  Administered 2021-11-19: 20 mg via ORAL
  Filled 2021-11-19 (×2): qty 1

## 2021-11-19 MED ORDER — FLUTICASONE PROPIONATE 50 MCG/ACT NA SUSP
2.0000 | Freq: Every day | NASAL | Status: DC
  Administered 2021-11-19 – 2021-11-21 (×3): 2 via NASAL
  Filled 2021-11-19 (×2): qty 16

## 2021-11-19 MED ORDER — VITAMIN D3 25 MCG PO TABS
1000.0000 [IU] | ORAL_TABLET | Freq: Every day | ORAL | Status: DC
  Administered 2021-11-20: 1000 [IU] via ORAL
  Filled 2021-11-19 (×4): qty 1

## 2021-11-19 MED ORDER — ONDANSETRON 4 MG PO TBDP
ORAL_TABLET | ORAL | Status: AC
  Administered 2021-11-19: 4 mg via ORAL
  Filled 2021-11-19: qty 1

## 2021-11-19 MED ORDER — ASCORBIC ACID 500 MG PO TABS
500.0000 mg | ORAL_TABLET | Freq: Every day | ORAL | Status: DC
  Administered 2021-11-20: 500 mg via ORAL
  Filled 2021-11-19 (×4): qty 1

## 2021-11-19 MED ORDER — LIDOCAINE VISCOUS HCL 2 % MT SOLN
15.0000 mL | Freq: Four times a day (QID) | OROMUCOSAL | Status: DC | PRN
  Filled 2021-11-19 (×5): qty 15

## 2021-11-19 MED ORDER — ADULT MULTIVITAMIN W/MINERALS CH
1.0000 | ORAL_TABLET | Freq: Every day | ORAL | Status: DC
  Administered 2021-11-19 – 2021-11-21 (×3): 1 via ORAL
  Filled 2021-11-19 (×6): qty 1

## 2021-11-19 MED ORDER — FLUOXETINE HCL 20 MG PO CAPS
40.0000 mg | ORAL_CAPSULE | Freq: Every day | ORAL | Status: DC
  Administered 2021-11-20 – 2021-11-21 (×2): 40 mg via ORAL
  Filled 2021-11-19 (×4): qty 2

## 2021-11-19 MED ORDER — FERROUS SULFATE 325 (65 FE) MG PO TABS
325.0000 mg | ORAL_TABLET | Freq: Every day | ORAL | Status: DC
  Administered 2021-11-20: 325 mg via ORAL
  Filled 2021-11-19 (×4): qty 1

## 2021-11-19 MED ORDER — MENTHOL 3 MG MT LOZG
1.0000 | LOZENGE | OROMUCOSAL | Status: DC | PRN
  Administered 2021-11-19: 3 mg via ORAL

## 2021-11-19 MED ORDER — MENTHOL 3 MG MT LOZG
LOZENGE | OROMUCOSAL | Status: AC
  Filled 2021-11-19: qty 9

## 2021-11-19 MED ORDER — LORATADINE 10 MG PO TABS
10.0000 mg | ORAL_TABLET | Freq: Every day | ORAL | Status: DC
  Administered 2021-11-19 – 2021-11-21 (×3): 10 mg via ORAL
  Filled 2021-11-19 (×6): qty 1

## 2021-11-19 NOTE — Hospital Course (Signed)
Chronic SI, self-harm 39-21 yo  Prozac 20mg   Alcohol: 6 shots liquor 2-3x/week   Plan: increase prozac to 40mg  11/11  Start vitamin D   Plan: discharge Sunday

## 2021-11-19 NOTE — Progress Notes (Signed)
D- Patient alert and oriented. Patient affect/mood.reported as improving.  Denies SI, HI, AVH. Reports discomfort from her sore throat, and no other complaints.  A-  Support and encouragement provided.  Routine safety checks conducted every 15 minutes.  Patient informed to notify staff with problems or concerns. R- Patient contracts for safety at this time. Patient compliant with treatment plan. Patient receptive, calm, and cooperative. Patient interacts well with others on the unit.  Patient remains safe at this time.

## 2021-11-19 NOTE — BH IP Treatment Plan (Signed)
Interdisciplinary Treatment and Diagnostic Plan Update  11/19/2021 Time of Session: 0830 Ana Church MRN: [id number]  Principal Diagnosis: MDD (major depressive disorder), recurrent episode, severe (HCC)  Secondary Diagnoses: Principal Problem:   MDD (major depressive disorder), recurrent episode, severe (HCC)   Current Medications:  Current Facility-Administered Medications  Medication Dose Route Frequency Provider Last Rate Last Admin   acetaminophen (TYLENOL) tablet 650 mg  650 mg Oral Q6H PRN Jackelyn Poling, NP   650 mg at 11/19/21 0931   alum & mag hydroxide-simeth (MAALOX/MYLANTA) 200-200-20 MG/5ML suspension 30 mL  30 mL Oral Q4H PRN Jackelyn Poling, NP       ascorbic acid (VITAMIN C) tablet 500 mg  500 mg Oral QHS Mariel Craft, MD       ferrous sulfate tablet 325 mg  325 mg Oral QHS Mariel Craft, MD       FLUoxetine (PROZAC) capsule 20 mg  20 mg Oral Daily Mariel Craft, MD       [START ON 11/20/2021] FLUoxetine (PROZAC) capsule 40 mg  40 mg Oral Daily Mariel Craft, MD       hydrOXYzine (ATARAX) tablet 25 mg  25 mg Oral TID PRN Jackelyn Poling, NP       magnesium hydroxide (MILK OF MAGNESIA) suspension 30 mL  30 mL Oral Daily PRN Jackelyn Poling, NP       multivitamin with minerals tablet 1 tablet  1 tablet Oral Daily Mariel Craft, MD       ondansetron (ZOFRAN-ODT) disintegrating tablet 4 mg  4 mg Oral Q8H PRN Sindy Guadeloupe, NP   4 mg at 11/19/21 0650   traZODone (DESYREL) tablet 50 mg  50 mg Oral QHS PRN Jackelyn Poling, NP       vitamin D3 (CHOLECALCIFEROL) tablet 1,000 Units  1,000 Units Oral QHS Mariel Craft, MD       PTA Medications: Medications Prior to Admission  Medication Sig Dispense Refill Last Dose   FLUoxetine (PROZAC) 10 MG capsule Take by mouth. (Patient not taking: Reported on 11/18/2021)      FLUoxetine (PROZAC) 20 MG capsule Take 20 mg by mouth daily.      Multiple Vitamins-Minerals (MULTIVITAMIN WITH MINERALS) tablet Take 1 tablet by  mouth daily.      PRESCRIPTION MEDICATION Apply 1 application  topically daily. Unknown prescribed facial cream       Patient Stressors: Other: PTSD    Patient Strengths: Average or above average intelligence  Capable of independent living  Communication skills  Motivation for treatment/growth  Physical Health  Supportive family/friends   Treatment Modalities: Medication Management, Group therapy, Case management,  1 to 1 session with clinician, Psychoeducation, Recreational therapy.   Physician Treatment Plan for Primary Diagnosis: MDD (major depressive disorder), recurrent episode, severe (HCC) Long Term Goal(s):     Short Term Goals:    Medication Management: Evaluate patient's response, side effects, and tolerance of medication regimen.  Therapeutic Interventions: 1 to 1 sessions, Unit Group sessions and Medication administration.  Evaluation of Outcomes: Progressing  Physician Treatment Plan for Secondary Diagnosis: Principal Problem:   MDD (major depressive disorder), recurrent episode, severe (HCC)  Long Term Goal(s):     Short Term Goals:       Medication Management: Evaluate patient's response, side effects, and tolerance of medication regimen.  Therapeutic Interventions: 1 to 1 sessions, Unit Group sessions and Medication administration.  Evaluation of Outcomes: Progressing   RN Treatment Plan for Primary Diagnosis:  MDD (major depressive disorder), recurrent episode, severe (HCC) Long Term Goal(s): Knowledge of disease and therapeutic regimen to maintain health will improve  Short Term Goals: Ability to remain free from injury will improve, Ability to verbalize frustration and anger appropriately will improve, Ability to demonstrate self-control, Ability to participate in decision making will improve, Ability to verbalize feelings will improve, Ability to disclose and discuss suicidal ideas, Ability to identify and develop effective coping behaviors will  improve, and Compliance with prescribed medications will improve  Medication Management: RN will administer medications as ordered by provider, will assess and evaluate patient's response and provide education to patient for prescribed medication. RN will report any adverse and/or side effects to prescribing provider.  Therapeutic Interventions: 1 on 1 counseling sessions, Psychoeducation, Medication administration, Evaluate responses to treatment, Monitor vital signs and CBGs as ordered, Perform/monitor CIWA, COWS, AIMS and Fall Risk screenings as ordered, Perform wound care treatments as ordered.  Evaluation of Outcomes: Progressing   LCSW Treatment Plan for Primary Diagnosis: MDD (major depressive disorder), recurrent episode, severe (HCC) Long Term Goal(s): Safe transition to appropriate next level of care at discharge, Engage patient in therapeutic group addressing interpersonal concerns.  Short Term Goals: Engage patient in aftercare planning with referrals and resources, Increase social support, Increase ability to appropriately verbalize feelings, Increase emotional regulation, Facilitate acceptance of mental health diagnosis and concerns, Facilitate patient progression through stages of change regarding substance use diagnoses and concerns, Identify triggers associated with mental health/substance abuse issues, and Increase skills for wellness and recovery  Therapeutic Interventions: Assess for all discharge needs, 1 to 1 time with Social worker, Explore available resources and support systems, Assess for adequacy in community support network, Educate family and significant other(s) on suicide prevention, Complete Psychosocial Assessment, Interpersonal group therapy.  Evaluation of Outcomes: Progressing   Progress in Treatment: Attending groups: Yes. Participating in groups: Yes. Taking medication as prescribed: Yes. Toleration medication: Yes. Family/Significant other contact made:  No, will contact:  CSW will obtain consent to reach family/friend.  Patient understands diagnosis: Yes. Discussing patient identified problems/goals with staff: Yes. Medical problems stabilized or resolved: Yes. Denies suicidal/homicidal ideation: No. Issues/concerns per patient self-inventory: Yes. Other: none  New problem(s) identified: No, Describe:  none  New Short Term/Long Term Goal(s): Patient to work towards medication management for mood stabilization; elimination of SI thoughts; development of comprehensive mental wellness plan.  Patient Goals:Patient states their goal for treatment is to "stabilize enough to think clearly."  Discharge Plan or Barriers: No psychosocial barriers identified at this time, patient to return to place of residence when appropriate for discharge.   Reason for Continuation of Hospitalization: Depression Medication stabilization  Estimated Length of Stay: 1-7 days   Last 3 [place] Suicide Severity Risk Score: Flowsheet Row Ad[facility] (Current[facility] Most recent reading at 11/18/2021  3:48 PM ED from 11/18/2021 in [facility] Most recent reading at 11/18/2021 12:56 AM ED from 03/09/2021 in [facility] Most recent reading at 03/09/2021  3:15 PM  C-SSRS RISK CATEGORY High Risk High Risk No Risk       Scribe for Treatment Team: Almedia Balls 11/19/2021 11:35 AM

## 2021-11-19 NOTE — Progress Notes (Signed)
D: Pt denied SI/HI/AVH this morning. Pt rated her depression a 2/10, anxiety a 3/10, and feelings of hopelessness a 3/10. Pt complains about 3/10 pain from a sore throat. Pt has been pleasant, calm, and cooperative throughout the shift.   A: RN provided support and encouragement to patient. Pt given scheduled medications as prescribed. Q15 min checks verified for safety.    R: Patient verbally contracts for safety. Patient compliant with medications and treatment plan. Patient is interacting well on the unit. Pt is safe on the unit.   11/19/21 1135  Psych Admission Type (Psych Patients Only)  Admission Status Voluntary  Psychosocial Assessment  Patient Complaints Anxiety  Eye Contact Fair  Facial Expression Flat  Affect Appropriate to circumstance  Speech Logical/coherent  Interaction Assertive  Motor Activity Other (Comment) (WDL)  Appearance/Hygiene Unremarkable  Behavior Characteristics Cooperative;Appropriate to situation  Mood Anxious;Pleasant  Thought Process  Coherency WDL  Content WDL  Delusions None reported or observed  Perception WDL  Hallucination None reported or observed  Judgment WDL  Confusion None  Danger to Self  Current suicidal ideation? Passive  Self-Injurious Behavior No self-injurious ideation or behavior indicators observed or expressed   Agreement Not to Harm Self Yes  Description of Agreement Verbal  Danger to Others  Danger to Others None reported or observed

## 2021-11-19 NOTE — Progress Notes (Signed)
   11/18/21 2200  Psych Admission Type (Psych Patients Only)  Admission Status Voluntary  Psychosocial Assessment  Patient Complaints Anxiety  Eye Contact Fair  Facial Expression Flat  Affect Appropriate to circumstance  Speech Logical/coherent  Interaction Minimal  Motor Activity Other (Comment) (WNL)  Behavior Characteristics Cooperative  Mood Anxious  Thought Process  Coherency WDL  Content WDL  Delusions None reported or observed  Perception WDL  Hallucination None reported or observed  Danger to Self  Current suicidal ideation? Denies  Agreement Not to Harm Self Yes  Description of Agreement  (Verbal)  Danger to Others  Danger to Others None reported or observed
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
## 2021-11-19 NOTE — Progress Notes (Signed)
Ana Church was requesting a Torah or Jewish resources.  Chaplain was able to print some prayers for healing and will follow up on Monday as she was about to attend a group.  [address], Bcc Pager, [phone]
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
## 2021-11-19 NOTE — [facility] Suicide Risk Assessment (Signed)
[facility] INPATIENT:  Family/Significant Other Suicide Prevention Education  Suicide Prevention Education:  Education Completed;  Iona Hansen Tour manager) Lucillie Garfinkel (partner) [phone] significant other has been identified by the patient as the family member/significant other with whom the patient will be residing, and identified as the person(s) who will aid the patient in the event of a mental health crisis (suicidal ideations/suicide attempt).  With written consent from the patient, the family member/significant other has been provided the following suicide prevention education, prior to the and/or following the discharge of the patient.  The suicide prevention education provided includes the following: Suicide risk factors Suicide prevention and interventions National Suicide Hotline telephone number [facility] assessment telephone number [facility] Emergency Assistance 911 [facility] and/or Residential Mobile Crisis Unit telephone number  Request made of family/significant other to: Remove weapons (e.g., guns, rifles, knives), all items previously/currently identified as safety concern.   Remove drugs/medications (over-the-counter, prescriptions, illicit drugs), all items previously/currently identified as a safety concern.  Iona Hansen Geralyn Corwin) [place] (partner) [phone] significant other verbalizes understanding of the suicide prevention education information provided.  The family member/significant other agrees to remove the items of safety concern listed above.  Tyshia Fenter S Chynna Buerkle 11/19/2021, 2:36 PM

## 2021-11-19 NOTE — Group Note (Signed)
Date:  11/19/2021 Time:  11:17 AM  Group Topic/Focus:  Orientation:   The focus of this group is to educate the patient on the purpose and policies of crisis stabilization and provide a format to answer questions about their admission.  The group details unit policies and expectations of patients while admitted.    Participation Level:  Active  Participation Quality:  Appropriate  Affect:  Appropriate  Cognitive:  Appropriate  Insight: Appropriate  Engagement in Group:  Engaged  Modes of Intervention:  Discussion  Additional Comments:     Reymundo Poll 11/19/2021, 11:17 AM

## 2021-11-19 NOTE — Group Note (Signed)
LCSW Group Therapy Note  Group Date: 11/19/2021 Start Time: 1300 End Time: 1400   Type of Therapy and Topic:  Group Therapy: Using "I" Statements  Participation Level:  Active  Description of Group:  Patients were asked to provide details of some interpersonal conflicts they have experienced. Patients were then educated about "I" statements, communication which focuses on feelings or views of the speaker rather than what the other person is doing. T group members were asked to reflect on past conflicts and to provide specific examples for utilizing "I" statements.  Therapeutic Goals:  Patients will verbalize understanding of ineffective communication and effective communication. Patients will be able to empathize with whom they are having conflict. Patients will practice effective communication in the form of "I" statements.    Summary of Patient Progress:  Belenda shared personal experience. The patient was present/active throughout the session and proved open to feedback from CSW and peers. Patient demonstrated good insight into the subject matter, was respectful of peers, and was present throughout the entire session.  Therapeutic Modalities:   Cognitive Behavioral Therapy Solution-Focused Therapy    Ane Payment, LCSW 11/19/2021  2:02 PM

## 2021-11-20 NOTE — [facility] Group Notes (Signed)
Pt attended orientation/goals group. Pt was alert and oriented. Pt participated in icebreaker: "Tell the group something interesting about yourself". Pt states that she loves school and she is majoring in nutrition and she will be finished in a few months. Pt goal for today is to figure out new coping skills for her mental health.

## 2021-11-20 NOTE — [facility] Group Notes (Signed)
Goals Group 11/20/21   Group Focus: affirmation, clarity of thought, and goals/reality orientation Treatment Modality:  Psychoeducation Interventions utilized were assignment, group exercise, and support Purpose: To be able to understand and verbalize the reason for their admission to the hospital. To understand that the medication helps with their chemical imbalance but they also need to work on their choices in life. To be challenged to develop a list of 30 positives about themselves. Also introduce the concept that "feelings" are not reality.  Participation Level:  did not attend  Ana Church A 

## 2021-11-20 NOTE — Progress Notes (Signed)
Adult Psychoeducational Group Note  Date:  11/20/2021 Time:  8:46 PM  Group Topic/Focus:  Wrap-Up Group:   The focus of this group is to help patients review their daily goal of treatment and discuss progress on daily workbooks.  Participation Level:  Active  Participation Quality:  Appropriate  Affect:  Appropriate  Cognitive:  Appropriate  Insight: Appropriate  Engagement in Group:  Engaged  Modes of Intervention:  Education and Exploration  Additional Comments:  Patient attended and participated in group tonight. She reports that today she seek mental health treatment by going to her groups.  Lita Mains [facility] 11/20/2021, 8:46 PM

## 2021-11-20 NOTE — [facility] Group Notes (Signed)
Psychoeducational Group Note    Date:  1111/2023 Time: 1300-1400    Purpose of Group: . The group focus' on teaching patients on how to identify their needs and their Life Skills:  A group where two lists are made. What people need and what are things that we do that are unhealthy. The lists are developed by the patients and it is explained that we often do the actions that are not healthy to get our list of needs met.  Goal:: to develop the coping skills needed to get their needs met  Participation Level:  did not attend  Jaleigh Mccroskey A    

## 2021-11-20 NOTE — Progress Notes (Signed)
[facility] MD Resident Progress Note  11/20/2021 6:04 AM Twania Bujak  MRN:  [id number] Principal Problem: MDD (major depressive disorder), recurrent episode, severe (HCC) Diagnosis: Principal Problem:   MDD (major depressive disorder), recurrent episode, severe (HCC) Active Problems:   Mild alcohol use disorder  Reason for admission   Soo "Asher Muir" Schexnider is a 21 y.o. female with a history of MDD, anxiety, and self harm/cutting, presenting with thoughts of cutting in the context of school and financial stressors and upcoming college graduation. (admitted on 11/18/2021, total  LOS: 2 days )  Chart review from last 24 hours   The patient's chart was reviewed and nursing notes were reviewed. The patient's case was discussed in multidisciplinary team meeting.  - Overnight events per chart review: Attended group therapy. Depression 2/10. Sore throat 3/10. Chaplain printed prayers for healing.  - Patient received all scheduled meds  - Patient received PRN cepacol lozenge 3mg  x2   Information obtained during interview   The patient was seen and evaluated on the unit. On assessment today the patient reports she is tired, reports she is not a morning person.  She denies any additional episodes of dizziness, lightheadedness or vomiting since yesterday.  She denies chest pain, shortness of breath, abdominal pain.  She reports that she slept pretty good last night.  She reports eating dinner and breakfast this morning without any nausea or vomiting.  She reports that her mood is "mostly stable."  She reports that she felt sad last night, "felt guilty for making people worry."  She reports that she talk to some of her friends on the phone who felt guilty that they did not check in on her more often.  Discussed whether or not her current feelings of guilt are helpful and she denies.  She reports her symptoms of depression "come and go."  She reports that she "feels same today compared to yesterday."  She denies  current thoughts of cutting herself, denies first rank symptoms.  She reports "worried about when I get out, that my depression, PTSD, anxiety will be the same."  Discussed that mental health conditions are like chronic medical conditions.  Discussed the importance of taking medications and following up outpatient.  She acknowledges the importance of long-term follow-up.  Review of Systems  Respiratory:  Negative for shortness of breath.   Cardiovascular:  Negative for chest pain.  Gastrointestinal:  Negative for abdominal pain, constipation, diarrhea, nausea and vomiting.  Neurological:  Negative for headaches.   Objective   Blood pressure 117/76, pulse 72, temperature 98.6 F (37 C), temperature source Oral, resp. rate 16, height 5\' 4"  (1.626 m), weight 65.3 kg, last menstrual period 11/01/2021, SpO2 99 %. Body mass index is 24.72 kg/m. Sleep: Good, # hours not charted    Current Medications: Current Facility-Administered Medications  Medication Dose Route Frequency Provider Last Rate Last Admin   acetaminophen (TYLENOL) tablet 650 mg  650 mg Oral Q6H PRN A, NP   650 mg at 11/19/21 0931   alum & mag hydroxide-simeth (MAALOX/MYLANTA) 200-200-20 MG/5ML suspension 30 mL  30 mL Oral Q4H PRN 13/10/23, NP       ascorbic acid (VITAMIN C) tablet 500 mg  500 mg Oral QHS 07-19-2000, MD       ferrous sulfate tablet 325 mg  325 mg Oral QHS Jackelyn Poling, MD       FLUoxetine (PROZAC) capsule 40 mg  40 mg Oral Daily Mariel Craft, MD  fluticasone (FLONASE) 50 MCG/ACT nasal spray 2 spray  2 spray Each Nare Daily Mariel Craft, MD   2 spray at 11/19/21 1709   hydrOXYzine (ATARAX) tablet 25 mg  25 mg Oral TID PRN Nira Conn A, NP       lidocaine (XYLOCAINE) 2 % viscous mouth solution 15 mL  15 mL Mouth/Throat Q6H PRN Mariel Craft, MD       loratadine (CLARITIN) tablet 10 mg  10 mg Oral Daily Mariel Craft, MD   10 mg at 11/19/21 1709   magnesium hydroxide  (MILK OF MAGNESIA) suspension 30 mL  30 mL Oral Daily PRN Jackelyn Poling, NP       menthol-cetylpyridinium (CEPACOL) lozenge 3 mg  1 lozenge Oral PRN Mariel Craft, MD   3 mg at 11/19/21 1608   multivitamin with minerals tablet 1 tablet  1 tablet Oral Daily Mariel Craft, MD   1 tablet at 11/19/21 1145   ondansetron (ZOFRAN-ODT) disintegrating tablet 4 mg  4 mg Oral Q8H PRN Sindy Guadeloupe, NP   4 mg at 11/19/21 0650   traZODone (DESYREL) tablet 50 mg  50 mg Oral QHS PRN Jackelyn Poling, NP       vitamin D3 (CHOLECALCIFEROL) tablet 1,000 Units  1,000 Units Oral QHS Mariel Craft, MD       Labs: Vit D 27.41 Ferritin 36 TSH 1.81 Lipid panel wnl A1c 5  Physical Exam Constitutional:      Appearance: the patient is not toxic-appearing.  Pulmonary:     Effort: Pulmonary effort is normal.  Neurological:     General: No focal deficit present.     Mental Status: the patient is alert and oriented to person, place, and time.  AIMS: N/A, patient is not taking antipsychotics   Mental Status Exam   Appearance and Grooming: Patient is casually dressed in T shirt and long pants . The patient has no noticeable scent or odor. She has piercings on her nose and upper and lower lip.  Motor activity: The patient's movement speed was normal; her gait was normal. There was no notable abnormal facial movements and no notable abnormal extremity movements. Behavior: The patient appears in no acute distress, and during the interview, was calm, focused, required minimal redirection, and behaving appropriately to scenario; she was able to follow commands and compliant to requests and made good eye contact. The patient did not appear internally or externally preoccupied. Attitude: Patient's attitude towards the interviewer was cooperative and open. Speech: The patient's speech was clear, fluent, with good articulation, and with appropriately placed inflections. The volume of her speech was soft and normal in  quantity. The rate was normal with a normal rhythm. Responses were normal in latency. There were no abnormal patterns in speech. Mood: "Mostly stable" Affect: Patient's affect is euthymic and anxious with broad range and even fluctuations; her affect is congruent with her stated mood. ------------------------------------------------------------------------------------------------------------------------- Thought Content The patient experiences no hallucinations. The patient describes no delusional thoughts; she denies thought insertion, denies thought withdrawal, denies thought interruption, and denies thought broadcasting. Patient at the time of interview denies active suicidal intent and denies passive suicidal ideation; she denies homicidal intent. Thought Process The patient's thought process is linear and is goal-directed. Insight The patient at the time of interview demonstrates fair insight, as evidenced by understanding of mental health condition/s. Judgement The patient over the past 24 hours demonstrates fair judgement, as evidenced by adhering to medication regimen and actively  participating in group therapy.  Assets: Manufacturing systems engineer; Desire for Improvement; Housing; Resilience; Social Support  Treatment Plan Summary: Daily contact with patient to assess and evaluate symptoms and progress in treatment and Medication management Summary   Imagine Mccreery is a 21 y.o. female with a history of MDD, anxiety, and self harm/cutting, presenting with thoughts of cutting in the context of school and financial stressors and upcoming college graduation who is being treated for her worsening SI and MDD with medications and therapy.  She appears to be tolerating the medications well, is denying side effects.   Diagnoses / Active Problems: MDD (major depressive disorder), recurrent episode, severe (HCC) Principal Problem:   MDD (major depressive disorder), recurrent episode, severe  (HCC) Active Problems:   Mild alcohol use disorder  Plan  Safety and Monitoring: -- Voluntary admission to inpatient psychiatric unit for safety, stabilization and treatment -- Daily contact with patient to assess and evaluate symptoms and progress in treatment -- Patient's case to be discussed in multi-disciplinary team meeting -- Observation Level : q15 minute checks -- Vital signs:  q12 hours -- Precautions: suicide, elopement, and assault  2. Medications:  #MDD, recurrent, severe  --INCREASE fluoxetine 40mg  for depression this AM  #Iron deficiency  --Continue ferrous sulfate 325mg  daily with vitamin C 500mg  daily   #Vitamin D deficiency  --Continue vitamin D 1000 IU daily   #Sore throat  Suspected postnasal drip from allergic rhinitis  --Continue claritin 10mg  daily  --Continue flonase --Continue PRN cepacol, viscous lidocaine   #PRNs -trazodone 50mg  for sleep  -hydroxyzine 25mg  TID for anxiety    Patient does not need nicotine replacement  The risks/benefits/side-effects/alternatives to the above medication were discussed in detail with the patient and time was given for questions. The patient consents to medication trial. FDA black box warnings, if present, were discussed.  The patient is agreeable with the medication plan, as above. We will monitor the patient's response to pharmacologic treatment, and adjust medications as necessary.  3. Routine and other pertinent labs: Lab Results:     Latest Ref Rng & Units 11/18/2021   12:30 AM 03/09/2021    3:38 PM 10/16/2019    1:43 AM  CBC  WBC 4.0 - 10.5 K/uL 8.2  11.3  14.2   Hemoglobin 12.0 - 15.0 g/dL     Hematocrit 36.0 - 46.0 % 38.4  44.6  41.2   Platelets 150 - 400 K/uL 276  236  300       Latest Ref Rng & Units 11/18/2021   12:30 AM 03/09/2021    3:38 PM 10/16/2019    1:43 AM  BMP  Glucose 70 - 99 mg/dL 90  42.7  06.2   BUN 6 - 20 mg/dL 10  8  12    Creatinine 0.44 - 1.00 mg/dL 37.6  13/09/2021  03/11/2021    Sodium 135 - 145 mmol/L 139  133  137   Potassium 3.5 - 5.1 mmol/L 3.8  3.2  3.2   Chloride 98 - 111 mmol/L 108  100  103   CO2 22 - 32 mmol/L 23  24  22    Calcium 8.9 - 10.3 mg/dL 8.9  8.8  9.1    Blood Alcohol level:  Lab Results  Component Value Date   ETH 100 (H) 11/18/2021   Prolactin: No results found for: "PROLACTIN" Lipid Panel: Lab Results  Component Value Date   CHOL 163 11/19/2021   TRIG 78 11/19/2021   HDL 59 11/19/2021  CHOLHDL 2.8 11/19/2021   VLDL 16 11/19/2021   LDLCALC 88 11/19/2021   HbgA1c: Lab Results  Component Value Date   HGBA1C 5.0 11/19/2021   TSH: Lab Results  Component Value Date   TSH 1.815 11/19/2021    4. Group Therapy: -- Encouraged patient to participate in unit milieu and in scheduled group therapies  -- Short Term Goals: Ability to identify changes in lifestyle to reduce recurrence of condition will improve, Ability to verbalize feelings will improve, Ability to disclose and discuss suicidal ideas, Ability to demonstrate self-control will improve, Ability to identify and develop effective coping behaviors will improve, Ability to maintain clinical measurements within normal limits will improve, and Ability to identify triggers associated with substance abuse/mental health issues will improve -- Long Term Goals: Improvement in symptoms so as ready for discharge -- Patient is encouraged to participate in group therapy while admitted to the psychiatric unit. -- We will address other chronic and acute stressors, which contributed to the patient's MDD (major depressive disorder), recurrent episode, severe (HCC) in order to reduce the risk of self-harm at discharge.  5. Discharge Planning:  -- Social work and case management to assist with discharge planning and identification of hospital follow-up needs prior to discharge. Signed 72 hour form, plan for discharge tomorrow.  -- Estimated LOS: 5-7 days -- Discharge Concerns: Need to establish a  safety plan; Medication compliance and effectiveness -- Discharge Goals: Return home with outpatient referrals for mental health follow-up including medication management/psychotherapy  I certify that inpatient services furnished can reasonably be expected to improve the patient's condition.   I discussed my assessment, planned testing and intervention for the patient with Dr. Lenon AhmadiPaliy who agrees with my formulated course of action.  Karie FetchStephanie Shayleigh Bouldin, MD, PGY-1 11/20/2021, 6:04 AM

## 2021-11-20 NOTE — Progress Notes (Signed)
Slept 8 hours  

## 2021-11-20 NOTE — BHH Group Notes (Signed)
Goals Group 11/20/21   Group Focus: affirmation, clarity of thought, and goals/reality orientation Treatment Modality:  Psychoeducation Interventions utilized were assignment, group exercise, and support Purpose: To be able to understand and verbalize the reason for their admission to the hospital. To understand that the medication helps with their chemical imbalance but they also need to work on their choices in life. To be challenged to develop a list of 30 positives about themselves. Also introduce the concept that "feelings" are not reality.  Participation Level:  did not attend  Tarnisha Kachmar A 

## 2021-11-20 NOTE — Progress Notes (Signed)
   11/20/21 2225  Psych Admission Type (Psych Patients Only)  Admission Status Voluntary  Psychosocial Assessment  Patient Complaints Anxiety  Eye Contact Fair  Facial Expression Animated  Affect Appropriate to circumstance  Speech Logical/coherent  Interaction Assertive  Motor Activity Other (Comment) (WDL)  Appearance/Hygiene Unremarkable  Behavior Characteristics Appropriate to situation  Mood Pleasant  Thought Process  Coherency WDL  Content WDL  Delusions None reported or observed  Perception WDL  Hallucination None reported or observed  Judgment WDL  Confusion None  Danger to Self  Current suicidal ideation? Denies  Self-Injurious Behavior No self-injurious ideation or behavior indicators observed or expressed   Agreement Not to Harm Self Yes  Description of Agreement verbal  Danger to Others  Danger to Others None reported or observed

## 2021-11-20 NOTE — Progress Notes (Signed)
Pt. Expressed moderate anxiety regarding discharge but amenable to collaborative planning and discussion on coping skills. Provided education on effects of trauma and ways to mitigate traumatic responses.  Pt animated and engaged in discussion. Verbalizes future focus and denies any current self-injurious ideation.

## 2021-11-20 NOTE — Group Note (Signed)
LCSW Group Therapy Note  11/20/2021    10:00-11:00am   Type of Therapy and Topic:  Group Therapy: Shame and Its Impact   Participation Level:  Active   Description of Group:   In this group, patients shared and discussed that guilt is the negative feeling we have when we've done something wrong, while shame is the negative feeling we have simply about "being."  In listening to each other share, patients learned that humans are all imperfect and that there is no shame in this.  We discussed how it could positively impact our wellbeing by accepting our faults as part of our being that can be worked on but does not have to shame [place].    Therapeutic Goals: Patients will learn the difference between guilt and shame. Patients will share their current shame feelings and how this has impacted their current lives. Patients will explore possible ways to think differently about those parts of their bodies, feelings, and lives about which they do have shame. Patients will learn that shame is universal, and that keeping our shame a secret actually increases its hold on [place].  Summary of Patient Progress:  The patient was late to group but once she arrived, she was a vocal and active participant.  She stated, with the agreement of many others, that at the moment a person becomes suicidal they do not care about others, telling themself "they'll get over it eventually."  As the group discussed this, she was able to identify the fallacy of that thinking.  Therapeutic Modalities:   Cognitive Behavioral Therapy Motivation Interviewing  Lynnell Chad  .

## 2021-11-21 MED ORDER — HYDROXYZINE HCL 25 MG PO TABS: 25.0000 mg | ORAL_TABLET | Freq: Three times a day (TID) | ORAL | 0 refills | Status: AC | PRN

## 2021-11-21 MED ORDER — TRAZODONE HCL 50 MG PO TABS: 50.0000 mg | ORAL_TABLET | Freq: Every evening | ORAL | 0 refills | Status: DC | PRN

## 2021-11-21 MED ORDER — FERROUS SULFATE 325 (65 FE) MG PO TABS: 325.0000 mg | ORAL_TABLET | Freq: Every day | ORAL | 0 refills | Status: DC

## 2021-11-21 MED ORDER — VITAMIN D3 25 MCG PO TABS: 1000.0000 [IU] | ORAL_TABLET | Freq: Every day | ORAL | 0 refills | Status: AC

## 2021-11-21 MED ORDER — FLUOXETINE HCL 40 MG PO CAPS: 40.0000 mg | ORAL_CAPSULE | Freq: Every day | ORAL | 0 refills | Status: DC

## 2021-11-21 NOTE — Progress Notes (Signed)
  [facility] Adult Case Management Discharge Plan :  Will you be returning to the same living situation after discharge:  Yes,  with significant other At discharge, do you have transportation home?: Yes,  patient arranged Do you have the ability to pay for your medications: Yes,  insurance  Release of information consent forms completed and emailed to Medical Records, then turned in to Medical Records by CSW.   Patient to Follow up at:  Follow-up Information     [facility]. Schedule an appointment as soon as possible for a visit.   Why: Please contact this agency and speak with Mitzi Davenport to schedule an appointment for therapy and medication management services. Contact information: [address] [place], [place] [address] Phone: [phone]        [facility] Follow up.   Specialty: Behavioral Health Why: Please contact this agency to schedule an intake assessment after discharge. Contact information: [address] [place] [address] [phone]                Next level of care provider has access to [facility] Link:no  Safety Planning and Suicide Prevention discussed: Yes,  with significant other     Has patient been referred to the Quitline?: N/A patient is not a smoker  Patient has been referred for addiction treatment: Yes  Lynnell Chad, LCSW 11/21/2021, 8:36 AM

## 2021-11-21 NOTE — Progress Notes (Signed)
Pt. provided with all personal items and documentation. Reviewed discharge directions and safety plan.  Discharged home with partner without incident.

## 2021-11-21 NOTE — BHH Group Notes (Signed)
Adult Psychoeducational Group  Date:  11/21/2021 Time:  1300-1400  Group Topic/Focus: Continuation of the group from Saturday. Looking at the lists that were created and talking about what needs to be done with the homework of 30 positives about themselves.                                     Talking about taking their power back and helping themselves to develop a positive self esteem.      Participation Quality:  did not attend  Antoni Stefan A   

## 2021-11-21 NOTE — Progress Notes (Signed)
Pt. Appeared with bright affect, interacted appropriately with peers and attended scheduled program activities. Offered support and expressed concern for peers who were observed to be distressed on unit. Pt. Approached RN for discussion in afternoon.  Reviewed suicide safety plan, coping skills and future plans. Pt. provided with all personal items and documentation. Reviewed discharge directions and safety plan.  Discharged home with partner without incident.

## 2021-11-21 NOTE — BHH Group Notes (Signed)
Adult Psychoeducational Group Note Date:  11/21/2021 Time:  0900-1000 Group Topic/Focus: PROGRESSIVE RELAXATION. A group where deep breathing is taught and tensing and relaxation muscle groups is used. Imagery is used as well.  Pts are asked to imagine 3 pillars that hold them up when they are not able to hold themselves up and to share that with the group.   Participation Level: did not attend   : Duana Benedict A   

## 2021-11-21 NOTE — Discharge Summary (Addendum)
Physician Discharge Summary Note  Patient:  Ana Church is an 21 y.o., female MRN:  [id number] DOB:  07-17-2000 Patient phone:  [phone] (home)  Patient address:   [address] [place] [address],  Total Time spent with patient: 45 minutes  Date of Admission:  11/18/2021 Date of Discharge: 11/21/2021  Reason for Admission:   Ana Church is a 21 y.o. female with a history of MDD, anxiety, and self harm/cutting, presenting with thoughts of cutting in the context of school and financial stressors and upcoming college graduation.   Principal Problem: MDD (major depressive disorder), recurrent episode, severe (HCC) Discharge Diagnoses: Principal Problem:   MDD (major depressive disorder), recurrent episode, severe (HCC) Active Problems:   Mild alcohol use disorder   Past Psychiatric History:  cutting ages 35 years, last when 20 years. Cuts when feeling frustrated, get thoughts straight, felt numb  Past Medical History:  Past Medical History:  Diagnosis Date   Anxiety    Depression    History reviewed. No pertinent surgical history. Family History:  Family History  Problem Relation Age of Onset   Hypertension Father    Diabetes Father    Family Psychiatric  History: none Social History:  Social History   Substance and Sexual Activity  Alcohol Use Yes   Comment: weekly     Social History   Substance and Sexual Activity  Drug Use Never    Social History   Socioeconomic History   Marital status: Single    Spouse name: Not on file   Number of children: Not on file   Years of education: Not on file   Highest education level: Not on file  Occupational History   Not on file  Tobacco Use   Smoking status: Never   Smokeless tobacco: Never  Vaping Use   Vaping Use: Never used  Substance and Sexual Activity   Alcohol use: Yes    Comment: weekly   Drug use: Never   Sexual activity: Yes    Birth control/protection: None  Other Topics  Concern   Not on file  Social History Narrative   Not on file   Social Determinants of Health   Financial Resource Strain: Not on file  Food Insecurity: Not on file  Transportation Needs: Not on file  Physical Activity: Not on file  Stress: Not on file  Social Connections: Not on file    Hospital Course:   The patient was admitted to the inpatient psychiatric unit due to symptoms of depression, anxiety, self-harming behaviors. She was restricted to ward and placed on suicide precautions. Patient was introduced to milieu activities and encouraged to participate in psycho-social groups. The plan at the time of admission was safety, stabilization and treatment.  Patient was treated with a combination of medication, therapy, and some educational activities during her hospital stay. She participated in individual and group therapy sessions, and attended various educational groups and participated in activities to help her manage her emotional and mental health. For the management of depressive disorder and anxiety, patient was continued on home medication Prozac and the dose was increased to 30mg  daily, then to 40mg  daily.  Patient was also given Gydroxizine 25mg  TID as needed for snxirty and Trazodone 50mg  at night as needed for sleep. Patient never required as needed medications for agitation or psychosis. Patient also started on ferrous sulfate 325 mg daily at bedtime taken with vitamin C 500 mg; and vitamin D 1000 IUs daily for supplementation. At the time of  discharge, patient was able to manage her symptoms of depression, anxiety, and denied suicidal ideation. She was able to identify her triggers and develop coping skills to help her manage her emotions. She was also able to develop positive relationships with staff and peers on the unit. Patient is being discharged with a follow-up appointment/referral to an outpatient mental health provider. Patient also has access to a 24-hour crisis hotline  and other community resources should she need additional support. Patient has been instructed to take her medications as prescribed, continue participating in therapy, and practice healthy lifestyle habits such as getting enough sleep and exercise. She was also reminded to reach out to her support system and the community resources available to her.  Discharge instructions:  Activity: as tolerated  Diet: heart healthy  # It is recommended to the patient to continue psychiatric medications as prescribed, after discharge from the hospital.     # It is recommended to the patient to follow up with your outpatient psychiatric provider -instructions on appointment date, time, and address (location) are provided to you in discharge paperwork   # It was discussed with the patient, the impact of alcohol, drugs, tobacco have been there overall psychiatric and medical wellbeing, and total abstinence from substance use was recommended to the patient.   # Prescriptions provided or sent directly to preferred pharmacy at discharge. Patient agreeable to plan. Given opportunity to ask questions. Appears to feel comfortable with discharge.    # In the event of worsening symptoms, the patient is instructed to call the crisis hotline, [phone], and or go to the nearest ED for appropriate evaluation and treatment of symptoms. To follow-up with primary care provider for other medical issues, concerns and or health care needs.   Physical Findings: AIMS:  , ,  ,  ,    CIWA:    COWS:     Musculoskeletal: Strength & Muscle Tone: within normal limits Gait & Station: normal Patient leans: N/A   Psychiatric Specialty Exam:  Presentation  General Appearance:  Casual; Neat  Eye Contact: Good  Speech: Clear and Coherent; Normal Rate  Speech Volume: Normal  Handedness: Right   Mood and Affect  Mood: Depressed; Anxious  Affect: Congruent; Blunt   Thought Process  Thought Processes: Goal  Directed  Descriptions of Associations:Intact  Orientation:Full (Time, Place and Person)  Thought Content:Logical  History of Schizophrenia/Schizoaffective disorder:No  Duration of Psychotic Symptoms:No data recorded Hallucinations:No data recorded Ideas of Reference:None  Suicidal Thoughts:No data recorded Homicidal Thoughts:No data recorded  Sensorium  Memory: Immediate Good; Recent Good; Remote Good  Judgment: Good  Insight: Good   Executive Functions  Concentration: Good  Attention Span: Good  Recall: Good  Fund of Knowledge: Good  Language: Good   Psychomotor Activity  Psychomotor Activity:No data recorded  Assets  Assets: Communication Skills; Desire for Improvement; Housing; Resilience; Social Support   Sleep  Sleep:No data recorded   Physical Exam: Physical Exam ROS Blood pressure 108/77, pulse 84, temperature 98.1 F (36.7 C), temperature source Oral, resp. rate 16, height 5\' 4"  (1.626 m), weight 65.3 kg, last menstrual period 11/01/2021, SpO2 99 %. Body mass index is 24.72 kg/m.   Social History   Tobacco Use  Smoking Status Never  Smokeless Tobacco Never   Tobacco Cessation:  N/A, patient does not currently use tobacco products   Blood Alcohol level:  Lab Results  Component Value Date   ETH 100 (H) 11/18/2021    Metabolic Disorder Labs:  Lab Results  Component Value Date   HGBA1C 5.0 11/19/2021   MPG 96.8 11/19/2021   No results found for: "PROLACTIN" Lab Results  Component Value Date   CHOL 163 11/19/2021   TRIG 78 11/19/2021   HDL 59 11/19/2021   CHOLHDL 2.8 11/19/2021   VLDL 16 11/19/2021   LDLCALC 88 11/19/2021    See Psychiatric Specialty Exam and Suicide Risk Assessment completed by Attending Physician prior to discharge.  Discharge destination:  Home  Is patient on multiple antipsychotic therapies at discharge:  No   Has Patient had three or more failed trials of antipsychotic monotherapy by  history:  No  Recommended Plan for Multiple Antipsychotic Therapies: NA   Allergies as of 11/21/2021   No Known Allergies      Medication List     STOP taking these medications    PRESCRIPTION MEDICATION       TAKE these medications      Indication  ferrous sulfate 325 (65 FE) MG tablet Take 1 tablet (325 mg total) by mouth at bedtime.  Indication: Iron Deficiency   FLUoxetine 40 MG capsule Commonly known as: PROZAC Take 1 capsule (40 mg total) by mouth daily. Start taking on: November 22, 2021 What changed:  medication strength how much to take when to take this Another medication with the same name was removed. Continue taking this medication, and follow the directions you see here.  Indication: Depression   hydrOXYzine 25 MG tablet Commonly known as: ATARAX Take 1 tablet (25 mg total) by mouth 3 (three) times daily as needed for anxiety.  Indication: Feeling Anxious   multivitamin with minerals tablet Take 1 tablet by mouth daily.  Indication: Nutritional Support   traZODone 50 MG tablet Commonly known as: DESYREL Take 1 tablet (50 mg total) by mouth at bedtime as needed for sleep.  Indication: Trouble Sleeping   vitamin D3 25 MCG tablet Commonly known as: CHOLECALCIFEROL Take 1 tablet (1,000 Units total) by mouth at bedtime.  Indication: Vitamin D Deficiency        Follow-up Information     [facility]. Schedule an appointment as soon as possible for a visit.   Why: Please contact this agency and speak with Mitzi Davenport to schedule an appointment for therapy and medication management services. Contact information: [address] [place], [place] [address] Phone: [phone]        [facility] Follow up.   Specialty: Behavioral Health Why: Please contact this agency to schedule an intake assessment after discharge. Contact information: [address] [place]  [phone]                Follow-up recommendations:   Emergency Contact Plan: Patient understands to call Suicide Hotline at 68, or Mobile Crisis at [phone], call 911, or go to the nearest ER as appropriate in case of thoughts of harm to self or others, or acute onset of intolerable side effects.  Signed: Thalia Party, MD 11/21/2021, 9:48 AM

## 2021-11-21 NOTE — Group Note (Signed)
[facility] LCSW Group Therapy Note  Date/Time:  11/21/2021  10:00AM-11:00AM  Type of Therapy and Topic:  Group Therapy:  Obstacles at Discharge  Participation Level:  Active   Description of Group: In this process group, members discussed their anticipated obstacles to wellness when they discharge.  Patients were asked to share what their goal is at discharge.  We talked in detail about the importance of setting boundaries in our lives and how to set such boundaries.  It was discussed how sometimes we set boundaries with others and sometimes we set those boundaries with ourselves.  We then listened to different songs that dealt with addiction, depression, and anxiety.  The group discussed how they could relate to each song and how each could help them to stay focused on their wellness.    Therapeutic Goals: Patients will determine one major goal that they wish to pursue at discharge, in terms of remaining well Patients will think about and acknowledge the obstacles they think they will face at hospital discharge Patients will be able to realize that they are not alone and others are actually facing similar obstacles Patients will be introduced to ways in which to set boundaries in a healthy, productive fashion Patients will identify how music can help or harm their recovery efforts Patients will explore the use of music as a coping skill  Summary of Patient Progress:  At the beginning of group, patient shared that her goal at discharge is to graduate and continue with her medications and therapy, while her obstacles at discharge will likely be motivation and production.  Her reaction to the music included many negative statements.  Therapeutic Modalities: Activity Processing   Ambrose Mantle, LCSW

## 2021-11-21 NOTE — BHH Suicide Risk Assessment (Signed)
[facility] Discharge Suicide Risk Assessment   Principal Problem: MDD (major depressive disorder), recurrent episode, severe (HCC) Discharge Diagnoses: Principal Problem:   MDD (major depressive disorder), recurrent episode, severe (HCC) Active Problems:   Mild alcohol use disorder   Ana "Ana Church" Church is a 21 y.o. female with a history of MDD, anxiety, and self harm/cutting, presenting with thoughts of cutting in the context of school and financial stressors and upcoming college graduation. (admitted on 11/18/2021, total  LOS: 2 days )     Physical Exam: Physical Exam ROS Blood pressure 108/77, pulse 84, temperature 98.1 F (36.7 C), temperature source Oral, resp. rate 16, height 5\' 4"  (1.626 m), weight 65.3 kg, last menstrual period 11/01/2021, SpO2 99 %. Body mass index is 24.72 kg/m.  Mental Status Per Nursing Assessment::   On Admission:  Suicidal ideation indicated by others  Demographic Factors:  Caucasian  Loss Factors: NA  Historical Factors: Impulsivity  Risk Reduction Factors:   Sense of responsibility to family, Living with another person, especially a relative, Positive social support, Positive therapeutic relationship, and Positive coping skills or problem solving skills  Continued Clinical Symptoms:  no  Cognitive Features That Contribute To Risk:  None    Suicide Risk:  Minimal: No identifiable suicidal ideation.  Patients presenting with no risk factors but with morbid ruminations; may be classified as minimal risk based on the severity of the depressive symptoms   Follow-up Information     [facility]. Schedule an appointment as soon as possible for a visit.   Why: Please contact this agency and speak with [phone] to schedule an appointment for therapy and medication management services. Contact information: [address] [place], [place] Phone: [phone]        [facility] Follow  up.   Specialty: Behavioral Health Why: Please contact this agency to schedule an intake assessment after discharge. Contact information: [address] [place] [phone]                Plan Of Care/Follow-up recommendations:  Emergency Contact Plan: Patient understands to call Suicide Hotline at 91, or Mobile Crisis at [phone], call 911, or go to the nearest ER as appropriate in case of thoughts of harm to self or others, or acute onset of intolerable side effects.  [phone], MD 11/21/2021, 10:06 AM

## 2021-12-15 IMAGING — CR DG CHEST 2V
2 series · 2 of 2 positions shown · non-contrast
Comparison: None.

CLINICAL DATA: Tachycardia

EXAM:
CHEST - 2 VIEW

[w chest pa]
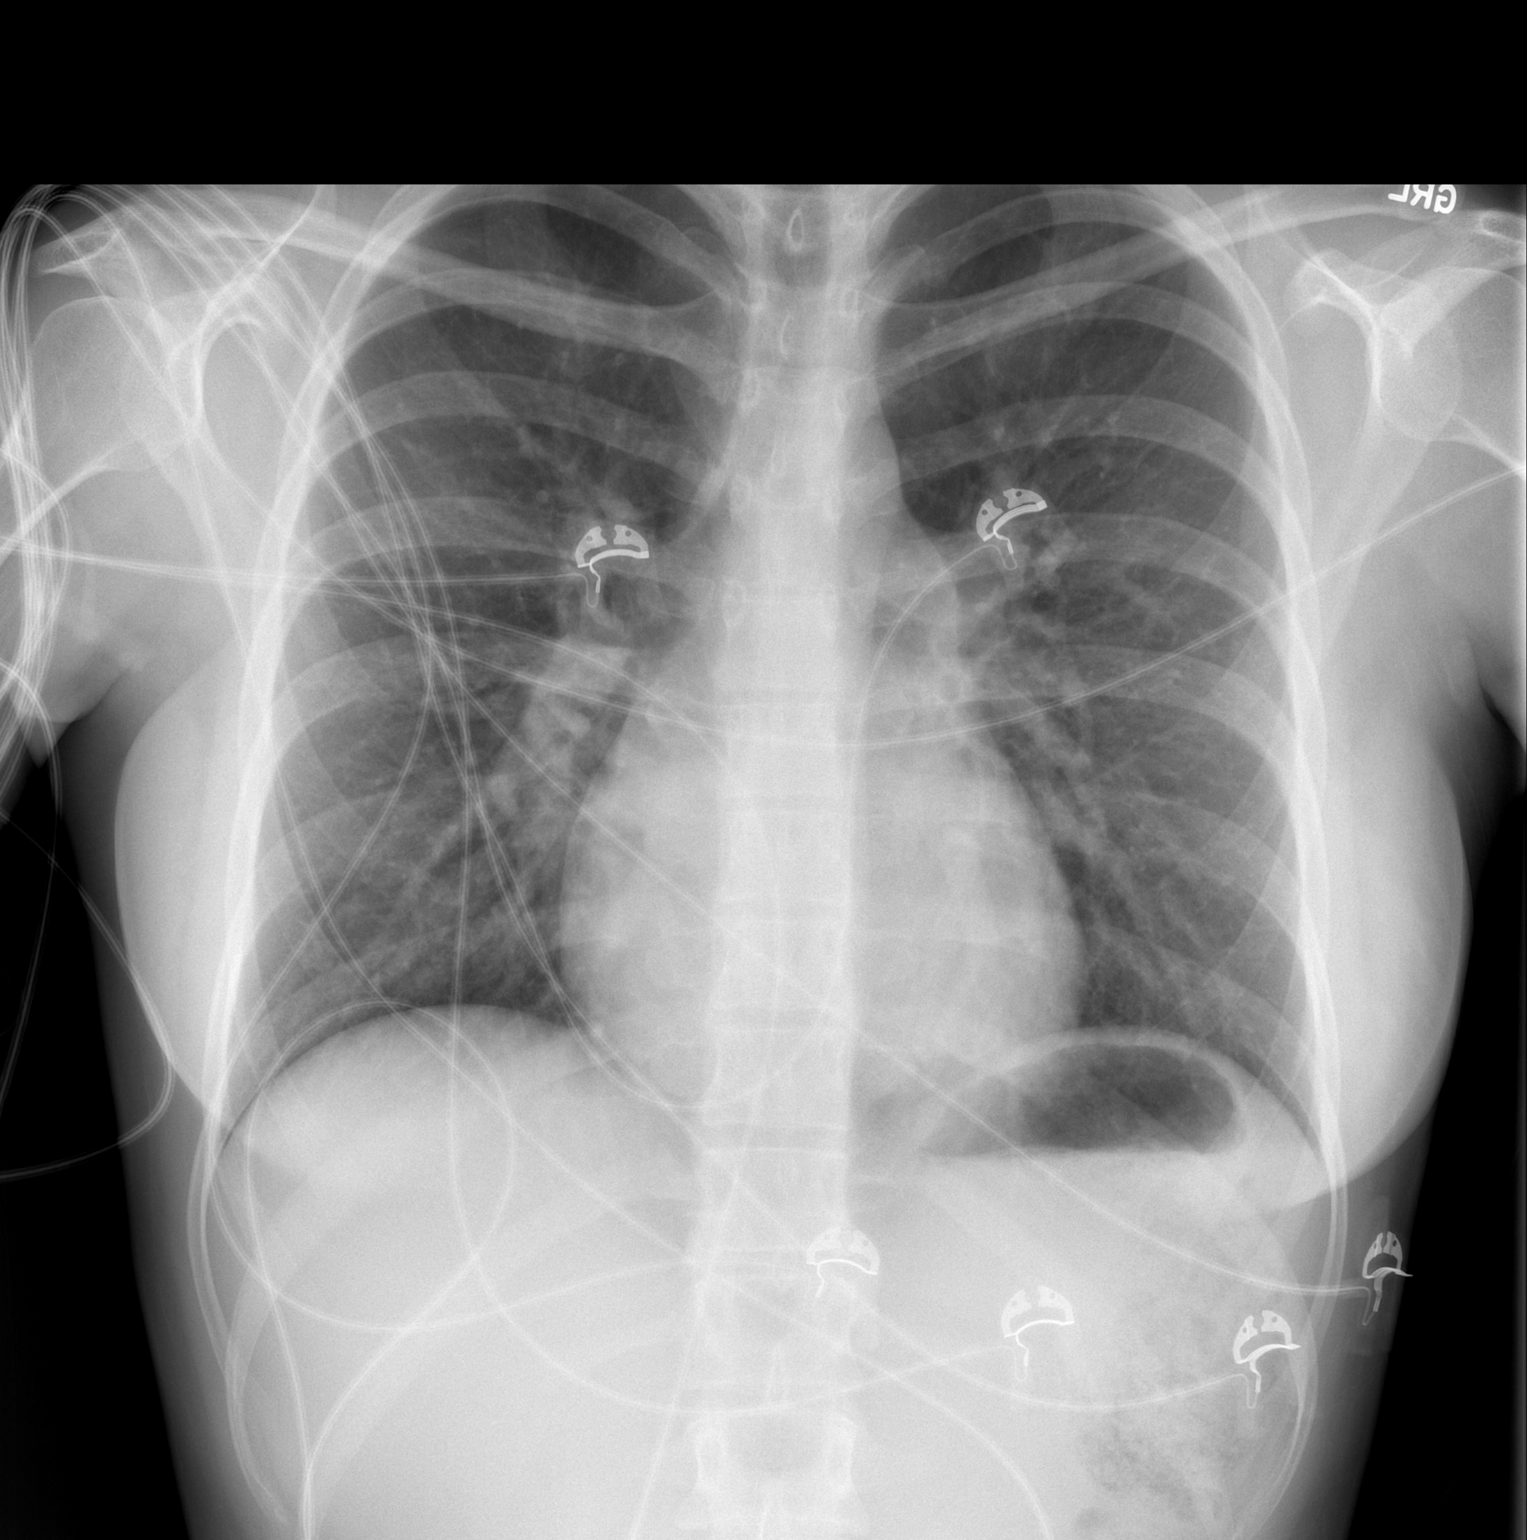

[w chest lat]
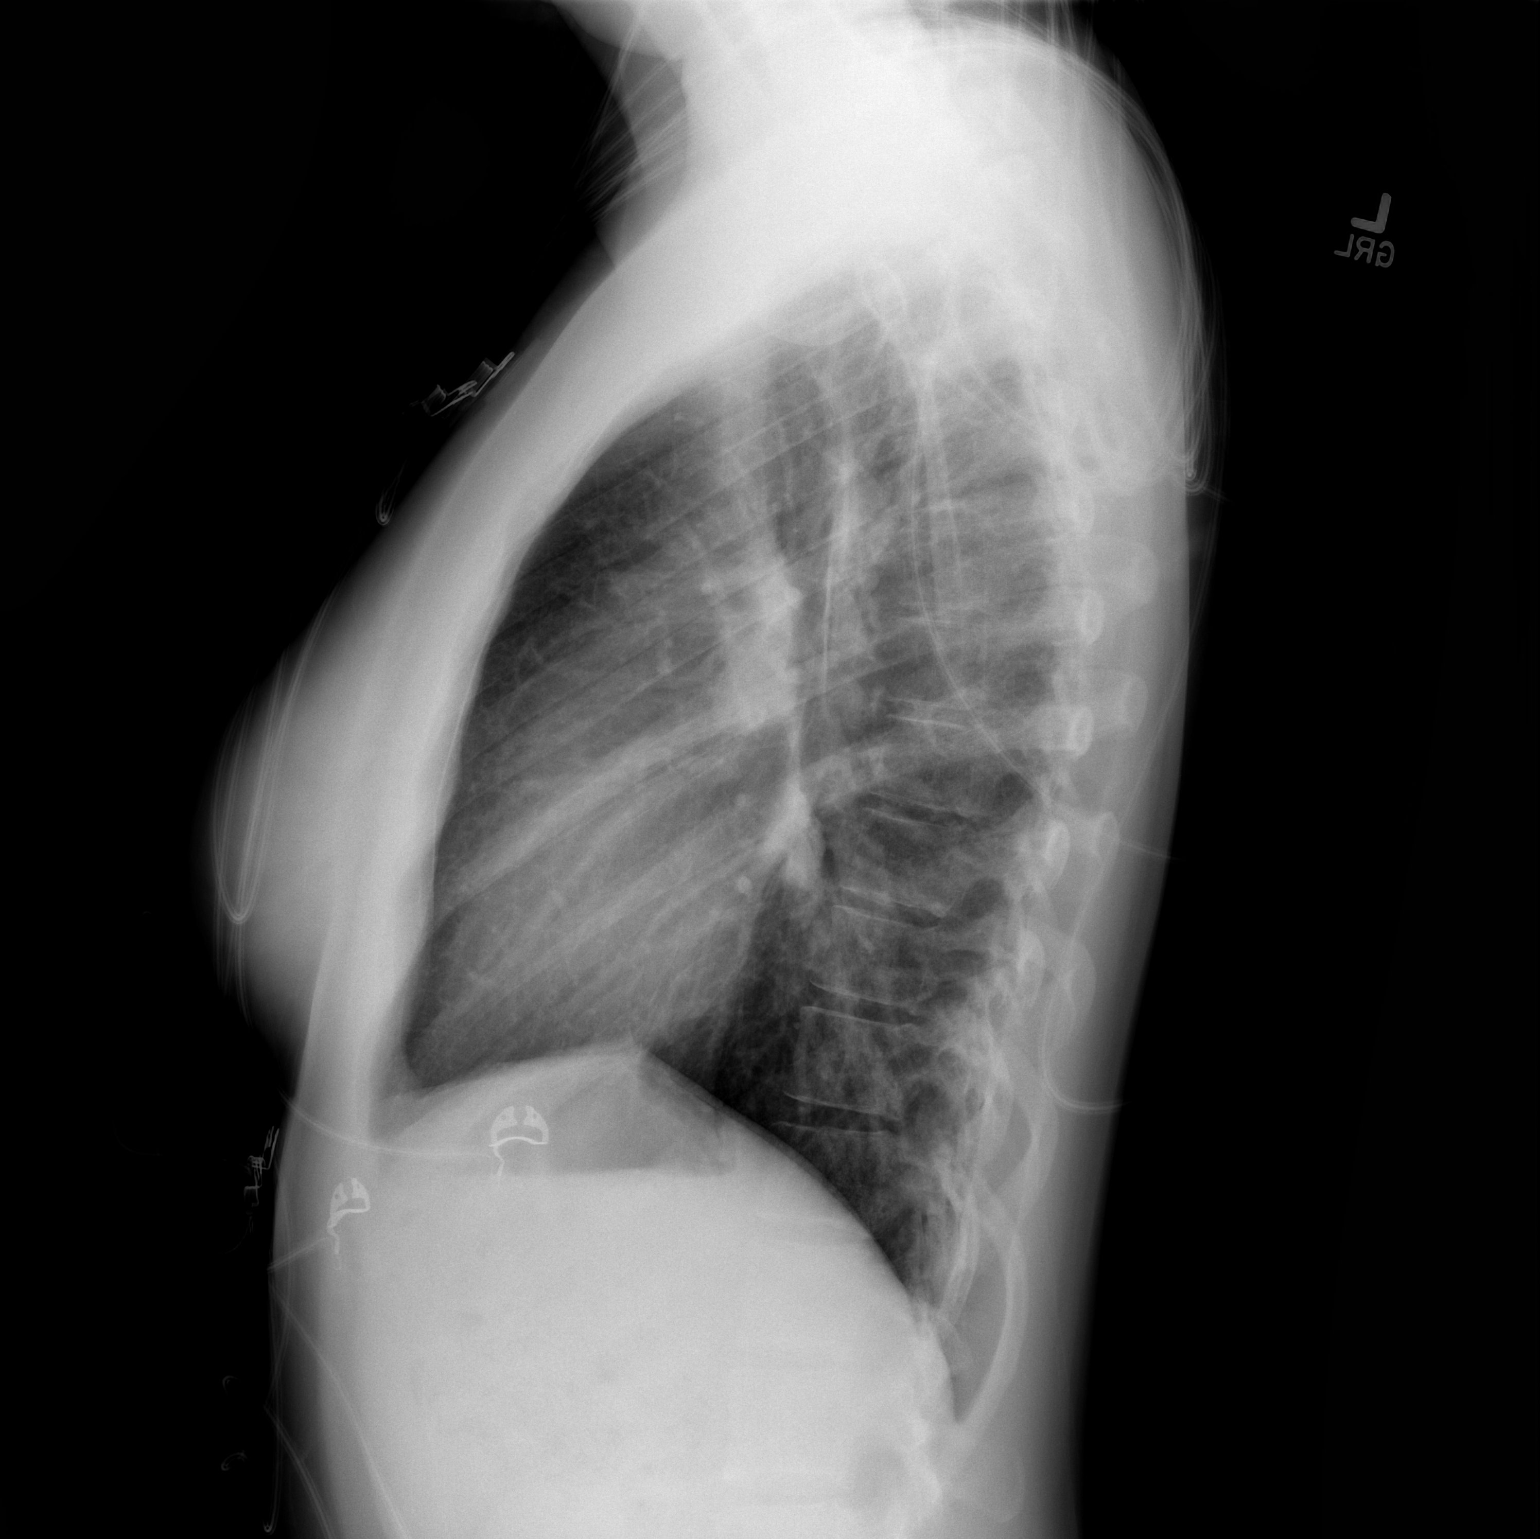

[2 of 2 positions shown; findings below may reference images not displayed]

FINDINGS: The heart size and mediastinal contours are within normal limits.
Both lungs are clear. The visualized skeletal structures are
unremarkable.
IMPRESSION: No active cardiopulmonary disease.

## 2022-08-29 ENCOUNTER — Encounter: Payer: Self-pay | Admitting: General Practice

## 2022-10-19 ENCOUNTER — Ambulatory Visit: Payer: Managed Care, Other (non HMO) | Admitting: Family Medicine

## 2022-10-26 ENCOUNTER — Ambulatory Visit: Payer: Managed Care, Other (non HMO) | Admitting: Family Medicine

## 2022-11-06 ENCOUNTER — Encounter (HOSPITAL_COMMUNITY): Payer: Self-pay | Admitting: Emergency Medicine

## 2022-11-06 ENCOUNTER — Emergency Department (HOSPITAL_COMMUNITY): Payer: Managed Care, Other (non HMO)

## 2022-11-06 ENCOUNTER — Other Ambulatory Visit: Payer: Self-pay

## 2022-11-06 ENCOUNTER — Emergency Department (HOSPITAL_COMMUNITY)
Admission: EM | Admit: 2022-11-06 | Discharge: 2022-11-06 | Discharge status: Against Medical Advice | Payer: Managed Care, Other (non HMO) | Attending: Emergency Medicine | Admitting: Emergency Medicine

## 2022-11-06 DIAGNOSIS — Z5321 Procedure and treatment not carried out due to patient leaving prior to being seen by health care provider: Secondary | ICD-10-CM | POA: Insufficient documentation

## 2022-11-06 DIAGNOSIS — R0602 Shortness of breath: Secondary | ICD-10-CM | POA: Insufficient documentation

## 2022-11-06 DIAGNOSIS — R42 Dizziness and giddiness: Secondary | ICD-10-CM | POA: Diagnosis present

## 2022-11-06 DIAGNOSIS — Z1152 Encounter for screening for COVID-19: Secondary | ICD-10-CM | POA: Insufficient documentation

## 2022-11-06 DIAGNOSIS — R002 Palpitations: Secondary | ICD-10-CM | POA: Insufficient documentation

## 2022-11-06 DIAGNOSIS — R531 Weakness: Secondary | ICD-10-CM | POA: Insufficient documentation

## 2022-11-06 DIAGNOSIS — R110 Nausea: Secondary | ICD-10-CM | POA: Diagnosis not present

## 2022-11-06 DIAGNOSIS — R55 Syncope and collapse: Secondary | ICD-10-CM | POA: Diagnosis not present

## 2022-11-06 LAB — CBC
HCT: 41.4 % (ref 36.0–46.0)
Hemoglobin: 14.1 g/dL (ref 12.0–15.0)
MCH: 32.9 pg (ref 26.0–34.0)
MCHC: 34.1 g/dL (ref 30.0–36.0)
MCV: 96.5 fL (ref 80.0–100.0)
Platelets: 283 10*3/uL (ref 150–400)
RBC: 4.29 MIL/uL (ref 3.87–5.11)
RDW: 12.4 % (ref 11.5–15.5)
WBC: 6.9 10*3/uL (ref 4.0–10.5)
nRBC: 0 % (ref 0.0–0.2)

## 2022-11-06 LAB — RESP PANEL BY RT-PCR (RSV, FLU A&B, COVID)  RVPGX2
Influenza A by PCR: NEGATIVE
Influenza B by PCR: NEGATIVE
Resp Syncytial Virus by PCR: NEGATIVE
SARS Coronavirus 2 by RT PCR: NEGATIVE

## 2022-11-06 LAB — BASIC METABOLIC PANEL
Anion gap: 13 (ref 5–15)
BUN: 11 mg/dL (ref 6–20)
CO2: 18 mmol/L — ABNORMAL LOW (ref 22–32)
Calcium: 9 mg/dL (ref 8.9–10.3)
Chloride: 106 mmol/L (ref 98–111)
Creatinine, Ser: 0.6 mg/dL (ref 0.44–1.00)
GFR, Estimated: >60 mL/min (ref >60)
Glucose, Bld: 83 mg/dL (ref 70–99)
Potassium: 3.7 mmol/L (ref 3.5–5.1)
Sodium: 137 mmol/L (ref 135–145)

## 2022-11-06 LAB — CBG MONITORING, ED: Glucose-Capillary: 81 mg/dL (ref 70–99)

## 2022-11-06 LAB — HCG, SERUM, QUALITATIVE: Preg, Serum: NEGATIVE

## 2022-11-06 NOTE — ED Triage Notes (Signed)
Pt reports dizziness and SHOB/dyspnea since this AM, along with weakness and near syncope. Pt also reports intermittent palpitations, states she has an appointment with a cardiologist in December.

## 2022-11-06 NOTE — ED Provider Triage Note (Signed)
Emergency Medicine Provider Triage Evaluation Note  Ana Church , a 22 y.o. female  was evaluated in triage.  Pt complains of dizzy, nausea, near syncope. Hx of similar  Review of Systems  Positive: Dizziness, sob Negative: Cp, abd pain  Physical Exam  BP 117/78 (BP Location: Left Arm)   Pulse (!) 101   Temp 98.2 F (36.8 C) (Oral)   Resp 15   Ht 5\' 4"  (1.626 m)   Wt 65.8 kg   LMP 10/24/2022 (Approximate)   SpO2 97%   BMI 24.89 kg/m  Gen:   Awake, no distress   Resp:  Normal effort  MSK:   Moves extremities without difficulty  Other:    Medical Decision Making  Medically screening exam initiated at 8:13 PM.  Appropriate orders placed.  Kamaiya Basgall was informed that the remainder of the evaluation will be completed by another provider, this initial triage assessment does not replace that evaluation, and the importance of remaining in the ED until their evaluation is complete.  Dizziness, sob, near syncope   Marlen Mollica A, PA-C 11/06/22 2013

## 2022-11-06 NOTE — ED Notes (Signed)
Pt notified triage she was leaving department at 2118

## 2022-11-07 ENCOUNTER — Encounter (HOSPITAL_COMMUNITY): Payer: Self-pay | Admitting: Emergency Medicine

## 2022-11-07 ENCOUNTER — Emergency Department (HOSPITAL_COMMUNITY)
Admission: EM | Admit: 2022-11-07 | Discharge: 2022-11-08 | Disposition: A | Discharge status: Home / Self Care | Payer: Managed Care, Other (non HMO) | Attending: Emergency Medicine | Admitting: Emergency Medicine

## 2022-11-07 ENCOUNTER — Other Ambulatory Visit: Payer: Self-pay

## 2022-11-07 DIAGNOSIS — R112 Nausea with vomiting, unspecified: Secondary | ICD-10-CM | POA: Diagnosis present

## 2022-11-07 DIAGNOSIS — R000 Tachycardia, unspecified: Secondary | ICD-10-CM | POA: Insufficient documentation

## 2022-11-07 LAB — D-DIMER, QUANTITATIVE: D-Dimer, Quant: 0.34 ug{FEU}/mL (ref 0.00–0.50)

## 2022-11-07 MED ORDER — ONDANSETRON 4 MG PO TBDP
4.0000 mg | ORAL_TABLET | Freq: Once | ORAL | Status: AC
  Administered 2022-11-07: 4 mg via ORAL
  Filled 2022-11-07: qty 1

## 2022-11-07 NOTE — ED Triage Notes (Addendum)
  Patient comes in with SOB and dizziness that has been going on for 2 days.  Patient was seen last night for the same and left without being seen.  Endorses nausea with no vomiting.  Dizziness when ambulating.  Patient tachycardic in triage.  No pain at this time.   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

## 2022-11-07 NOTE — ED Provider Notes (Signed)
[place] EMERGENCY DEPARTMENT AT [facility] Provider Note   CSN: [id number] Arrival date & time: 11/07/22  2019     History {Add pertinent medical, surgical, social history, OB history to HPI:1} Chief Complaint  Patient presents with   Nausea   Dizziness    Ana Church is a 22 y.o. female.  22 year old female with history of depression and anxiety presents with complaint of feeling generally weak, nausea with retching, dizziness described as near syncope and SHOB. Symptoms started yesterday, although notes the retching started months ago and has been intermittent.  Recently started NAC supplement BID 1 month ago, unsure if related or exacerbating her symptoms. Was admitted to CDU of another hospital 1 month ago, found to have hypoMg, replaced and monitored and symptoms resolved. Seen by cardiology with holter in the past and told she needed to get used to her symptoms. After recent obs admission, was provided with with Zio which she has not started, with follow up with cards scheduled for December.   No recent illness, falls, travel (other than trip to [place] last weekend), no history of PE/DVT.        Home Medications Prior to Admission medications   Medication Sig Start Date End Date Taking? Authorizing Provider  ferrous sulfate 325 (65 FE) MG tablet Take 1 tablet (325 mg total) by mouth at bedtime. 11/21/21 12/21/21  Thalia Party, MD  FLUoxetine (PROZAC) 40 MG capsule Take 1 capsule (40 mg total) by mouth daily. 11/22/21 12/22/21  Thalia Party, MD  Multiple Vitamins-Minerals (MULTIVITAMIN WITH MINERALS) tablet Take 1 tablet by mouth daily.    [provider]  traZODone (DESYREL) 50 MG tablet Take 1 tablet (50 mg total) by mouth at bedtime as needed for sleep. 11/21/21 12/21/21  Thalia Party, MD      Allergies    Patient has no known allergies.    Review of Systems   Review of Systems Negative except as per HPI Physical Exam Updated Vital  Signs BP (!) 121/98 (BP Location: Left Arm)   Pulse (!) 121   Temp 97.6 F (36.4 C) (Oral)   Resp 17   Ht 5\' 4"  (1.626 m)   Wt 65.8 kg   LMP 10/24/2022 (Approximate)   SpO2 99%   BMI 24.89 kg/m  Physical Exam Vitals and nursing note reviewed.  Constitutional:      General: She is not in acute distress.    Appearance: She is well-developed. She is not diaphoretic.  HENT:     Head: Normocephalic and atraumatic.  Cardiovascular:     Rate and Rhythm: Regular rhythm. Tachycardia present.     Pulses: Normal pulses.     Heart sounds: Normal heart sounds.  Pulmonary:     Effort: Pulmonary effort is normal.     Breath sounds: Normal breath sounds.  Musculoskeletal:     Cervical back: Neck supple.     Right lower leg: No edema.     Left lower leg: No edema.  Skin:    General: Skin is warm and dry.     Findings: No erythema or rash.  Neurological:     Mental Status: She is alert and oriented to person, place, and time.     Motor: No weakness.  Psychiatric:        Behavior: Behavior normal.     ED Results / Procedures / Treatments   Labs (all labs ordered are listed, but only abnormal results are displayed) Labs Reviewed - No  data to display  EKG None  Radiology DG Chest 2 View  Result Date: 11/06/2022 CLINICAL DATA:  [facility] EXAM: CHEST - 2 VIEW COMPARISON:  October 06, 2022, October 16, 2019 FINDINGS: The cardiomediastinal silhouette is unchanged in contour.Similar appearance of prominent pulmonary vasculature. No pleural effusion. No pneumothorax. No acute pleuroparenchymal abnormality. Visualized abdomen is unremarkable. No acute osseous abnormality. IMPRESSION: No acute cardiopulmonary abnormality. Electronically Signed   By: Meda Klinefelter M.D.   On: 11/06/2022 20:17    Procedures Procedures  {Document cardiac monitor, telemetry assessment procedure when appropriate:1}  Medications Ordered in ED Medications  ondansetron (ZOFRAN-ODT) disintegrating tablet 4 mg  (4 mg Oral Given 11/07/22 2058)    ED Course/ Medical Decision Making/ A&P   {   Click here for ABCD2, HEART and other calculatorsREFRESH Note before signing :1}                              Medical Decision Making Amount and/or Complexity of Data Reviewed Labs: ordered.   This patient presents to the ED for concern of ***, this involves an extensive number of treatment options, and is a complaint that carries with it a high risk of complications and morbidity.  The differential diagnosis includes ***   Co morbidities that complicate the patient evaluation  ***   Additional history obtained:  Additional history obtained from *** External records from outside source obtained and reviewed including ***   Lab Tests:  I Ordered, and personally interpreted labs.  The pertinent results include:  ***   Imaging Studies ordered:  I ordered imaging studies including ***  I independently visualized and interpreted imaging which showed *** I agree with the radiologist interpretation   Cardiac Monitoring: / EKG:  The patient was maintained on a cardiac monitor.  I personally viewed and interpreted the cardiac monitored which showed an underlying rhythm of: ***   Consultations Obtained:  I requested consultation with the ***,  and discussed lab and imaging findings as well as pertinent plan - they recommend: ***   Problem List / ED Course / Critical interventions / Medication management  *** I ordered medication including ***  for ***  Reevaluation of the patient after these medicines showed that the patient {resolved/improved/worsened:23923::"improved"} I have reviewed the patients home medicines and have made adjustments as needed   Social Determinants of Health:  ***   Test / Admission - Considered:  ***   {Document critical care time when appropriate:1} {Document review of labs and clinical decision tools ie heart score, Chads2Vasc2 etc:1}  {Document your  independent review of radiology images, and any outside records:1} {Document your discussion with family members, caretakers, and with consultants:1} {Document social determinants of health affecting pt's care:1} {Document your decision making why or why not admission, treatments were needed:1} Final Clinical Impression(s) / ED Diagnoses Final diagnoses:  None    Rx / DC Orders ED Discharge Orders     None

## 2022-11-08 DIAGNOSIS — R112 Nausea with vomiting, unspecified: Secondary | ICD-10-CM | POA: Diagnosis not present

## 2022-11-08 LAB — MAGNESIUM: Magnesium: 2 mg/dL (ref 1.7–2.4)

## 2022-11-08 MED ORDER — ONDANSETRON 4 MG PO TBDP
4.0000 mg | ORAL_TABLET | Freq: Three times a day (TID) | ORAL | 0 refills | Status: DC | PRN
Start: 2022-11-08 — End: 2022-11-08

## 2022-11-08 MED ORDER — ONDANSETRON 4 MG PO TBDP: 4.0000 mg | ORAL_TABLET | Freq: Three times a day (TID) | ORAL | 0 refills | Status: DC | PRN

## 2022-11-08 NOTE — Discharge Instructions (Signed)
Follow up with your primary care provider.  Referral to cardiology provided. Consider starting your Zio patch/ambulatory monitoring device.  Zofran as needed as prescribed for nausea and vomiting.

## 2022-11-11 ENCOUNTER — Ambulatory Visit: Payer: Managed Care, Other (non HMO) | Admitting: Nurse Practitioner

## 2022-11-11 VITALS — BP 114/84 | HR 94 | Temp 97.7°F | Ht 64.0 in | Wt 147.0 lb

## 2022-11-11 DIAGNOSIS — Z124 Encounter for screening for malignant neoplasm of cervix: Secondary | ICD-10-CM

## 2022-11-11 DIAGNOSIS — E559 Vitamin D deficiency, unspecified: Secondary | ICD-10-CM

## 2022-11-11 DIAGNOSIS — Z23 Encounter for immunization: Secondary | ICD-10-CM | POA: Diagnosis not present

## 2022-11-11 DIAGNOSIS — R5383 Other fatigue: Secondary | ICD-10-CM | POA: Diagnosis not present

## 2022-11-11 DIAGNOSIS — R000 Tachycardia, unspecified: Secondary | ICD-10-CM

## 2022-11-11 DIAGNOSIS — R7989 Other specified abnormal findings of blood chemistry: Secondary | ICD-10-CM

## 2022-11-11 DIAGNOSIS — F32A Depression, unspecified: Secondary | ICD-10-CM

## 2022-11-11 DIAGNOSIS — F419 Anxiety disorder, unspecified: Secondary | ICD-10-CM

## 2022-11-11 LAB — VITAMIN D 25 HYDROXY (VIT D DEFICIENCY, FRACTURES): VITD: 21.74 ng/mL — ABNORMAL LOW (ref 30.00–100.00)

## 2022-11-11 LAB — VITAMIN B12: Vitamin B-12: 377 pg/mL (ref 211–911)

## 2022-11-11 MED ORDER — FLUOXETINE HCL 40 MG PO CAPS: 40.0000 mg | ORAL_CAPSULE | Freq: Every day | ORAL | 1 refills | Status: DC

## 2022-11-11 MED ORDER — HYDROXYZINE HCL 25 MG PO TABS: 25.0000 mg | ORAL_TABLET | Freq: Three times a day (TID) | ORAL | 0 refills | Status: AC | PRN

## 2022-11-11 NOTE — Assessment & Plan Note (Signed)
Chronic, stable. Continue fluoxetine 40mg  daily and hydroxyzine 25mg  as needed. She was seeing a therapist, however this was when she was going to [facility] and would like to establish with someone new. Referral placed today. Refills sent to the pharmacy.

## 2022-11-11 NOTE — Assessment & Plan Note (Signed)
Vitamin D was slightly low in the past. Will check today and treat based on results.

## 2022-11-11 NOTE — Assessment & Plan Note (Signed)
She had an episode of fatigue, light headedness, tingling in her fingers and face, and palpitations and went to the ER. BMP, CBC, chest x-ray, and EKG normal. She is currently wearing a zio monitor and has an appointment with cardiology in December. Will check ANA and vitamin B12 today.

## 2022-11-11 NOTE — Assessment & Plan Note (Signed)
TSH was elevated 3 years ago at 8. Will check thyroid panel and TPO antibodies.

## 2022-11-11 NOTE — Assessment & Plan Note (Signed)
She has experienced heart issues for the past three years, including palpitations, irregular heartbeats, shaking, sweating, and episodes of pre-syncope. She is currently wearing a heart monitor and has a cardiology appointment in December. We will continue monitoring with the heart monitor and follow up with cardiology in December. Continue drinking plenty of fluids and she can wear compression socks as well. She is currently being evaluated for dysautonomia.

## 2022-11-11 NOTE — Patient Instructions (Addendum)
It was great to see you!  We are checking your labs today and will let you know the results via mychart/phone.   Keep drinking plenty of water  I have refilled your fluoxetine and hydroxyzine.   I have placed a referral to GYN.   Let's follow-up in 3 months, sooner if you have concerns.  If a referral was placed today, you will be contacted for an appointment. Please note that routine referrals can sometimes take up to 3-4 weeks to process. Please call our office if you haven't heard anything after this time frame.  Take care,  Rodman Pickle, NP

## 2022-11-11 NOTE — Progress Notes (Signed)
New Patient Visit  BP 114/84 (BP Location: Left Arm)   Pulse 94   Temp 97.7 F (36.5 C)   Ht 5\' 4"  (1.626 m)   Wt 147 lb (66.7 kg)   LMP 10/24/2022 (Approximate)   SpO2 97%   BMI 25.23 kg/m    Subjective:    Patient ID: Ana Church, female    DOB: [birth year], 22 y.o.   MRN: [id number]  CC: Chief Complaint  Patient presents with   Establish Care    NP. Est. Care, went to ED on 11-07-22    HPI: Ana Church is a 22 y.o. female presents for new patient visit to establish care.  Introduced to Publishing rights manager role and practice setting.  All questions answered.  Discussed provider/patient relationship and expectations.  Discussed the use of AI scribe software for clinical note transcription with the patient, who gave verbal consent to proceed.  History of Present Illness   Ana Church, a patient with a history of ongoing heart issues, presents with a recent increase in symptoms. She reports episodes of extreme weakness to the point of being unable to hold up her head, shakiness, constant nausea, and dry heaving. These symptoms were severe enough to warrant two visits to the ER, where labs, EKG, and chest x-ray were reportedly normal.  In addition to these symptoms, Ana Church shortness of breath, tingling and numbness in her hands and fingers, heart palpitations, and altered vision, including seeing colors and black spots. These symptoms were different from her usual heart-related symptoms and were severe enough to prompt a visit to urgent care, where she received a Zofran shot and a prescription for Zofran.  Ana Church also reports a history of a high thyroid level, which was discovered during a major episode a few years ago that resulted in her passing out and being taken to the ER. She describes her heart issues as coming and going, sometimes presenting as a minor sensation and other times as a major episode.  She also reports a recent skin issue, with welts appearing  on her legs and arms that resembled bug bites. These welts were itchy and eventually went away on their own.  Ana Church is currently wearing a heart monitor and has an upcoming appointment with cardiology.   She has a history of depression and anxiety. She is currently taking fluoxetine 40mg  daily and hydroxyzine 25mg  as needed. She states that her symptoms are well controlled and her phq 9 score today is elevated from recent episode of weakness.        11/11/2022    2:26 PM  Depression screen PHQ 2/9  Decreased Interest 1  Down, Depressed, Hopeless 0  PHQ - 2 Score 1  Altered sleeping 2  Tired, decreased energy 2  Change in appetite 2  Feeling bad or failure about yourself  1  Trouble concentrating 1  Moving slowly or fidgety/restless 1  Suicidal thoughts 0  PHQ-9 Score 10  Difficult doing work/chores Extremely dIfficult      11/11/2022    2:26 PM  GAD 7 : Generalized Anxiety Score  Nervous, Anxious, on Edge 1  Control/stop worrying 1  Worry too much - different things 1  Trouble relaxing 1  Restless 1  Easily annoyed or irritable 0  Afraid - awful might happen 0  Total GAD 7 Score 5  Anxiety Difficulty Somewhat difficult     Past Medical History:  Diagnosis Date   Anxiety    Depression     History  reviewed. No pertinent surgical history.  Family History  Problem Relation Age of Onset   Hypertension Father    Diabetes Father      Social History   Tobacco Use   Smoking status: Never   Smokeless tobacco: Never  Vaping Use   Vaping status: Never Used  Substance Use Topics   Alcohol use: Yes    Alcohol/week: 4.0 standard drinks of alcohol    Types: 4 Shots of liquor per week    Comment: occassionally   Drug use: Never    Current Outpatient Medications on File Prior to Visit  Medication Sig Dispense Refill   ondansetron (ZOFRAN-ODT) 4 MG disintegrating tablet Take 1 tablet (4 mg total) by mouth every 8 (eight) hours as needed for nausea or vomiting. 12  tablet 0   No current facility-administered medications on file prior to visit.     Review of Systems  Constitutional:  Positive for fatigue. Negative for fever.  HENT: Negative.    Respiratory:  Positive for chest tightness. Negative for shortness of breath.   Cardiovascular:  Positive for palpitations. Negative for chest pain.  Gastrointestinal:  Positive for abdominal pain, constipation and nausea. Negative for diarrhea.  Genitourinary: Negative.   Musculoskeletal: Negative.   Skin: Negative.   Neurological:  Positive for dizziness (few days ago). Negative for headaches.  Psychiatric/Behavioral: Negative.        Objective:    BP 114/84 (BP Location: Left Arm)   Pulse 94   Temp 97.7 F (36.5 C)   Ht 5\' 4"  (1.626 m)   Wt 147 lb (66.7 kg)   LMP 10/24/2022 (Approximate)   SpO2 97%   BMI 25.23 kg/m   Wt Readings from Last 3 Encounters:  11/11/22 147 lb (66.7 kg)  11/07/22 145 lb (65.8 kg)  11/06/22 145 lb (65.8 kg)    BP Readings from Last 3 Encounters:  11/11/22 114/84  11/08/22 110/73  11/06/22 117/78    Physical Exam Vitals and nursing note reviewed.  Constitutional:      General: She is not in acute distress.    Appearance: Normal appearance.  HENT:     Head: Normocephalic and atraumatic.     Right Ear: Tympanic membrane, ear canal and external ear normal.     Left Ear: Tympanic membrane, ear canal and external ear normal.  Eyes:     Conjunctiva/sclera: Conjunctivae normal.  Cardiovascular:     Rate and Rhythm: Normal rate and regular rhythm.     Pulses: Normal pulses.     Heart sounds: Normal heart sounds.  Pulmonary:     Effort: Pulmonary effort is normal.     Breath sounds: Normal breath sounds.  Abdominal:     Palpations: Abdomen is soft.     Tenderness: There is no abdominal tenderness.  Musculoskeletal:        General: Normal range of motion.     Cervical back: Normal range of motion and neck supple.     Right lower leg: No edema.     Left  lower leg: No edema.  Lymphadenopathy:     Cervical: No cervical adenopathy.  Skin:    General: Skin is warm and dry.  Neurological:     General: No focal deficit present.     Mental Status: She is alert and oriented to person, place, and time.     Cranial Nerves: No cranial nerve deficit.     Coordination: Coordination normal.     Gait: Gait normal.  Psychiatric:  Mood and Affect: Mood normal.        Behavior: Behavior normal.        Thought Content: Thought content normal.        Judgment: Judgment normal.        Assessment & Plan:   Problem List Items Addressed This Visit       Other   Tachycardia - Primary    She has Church heart issues for the past three years, including palpitations, irregular heartbeats, shaking, sweating, and episodes of pre-syncope. She is currently wearing a heart monitor and has a cardiology appointment in December. We will continue monitoring with the heart monitor and follow up with cardiology in December. Continue drinking plenty of fluids and she can wear compression socks as well. She is currently being evaluated for dysautonomia.          Fatigue    She had an episode of fatigue, light headedness, tingling in her fingers and face, and palpitations and went to the ER. BMP, CBC, chest x-ray, and EKG normal. She is currently wearing a zio monitor and has an appointment with cardiology in December. Will check ANA and vitamin B12 today.       Relevant Orders   ANA w/Reflex   Vitamin B12   Vitamin D insufficiency    Vitamin D was slightly low in the past. Will check today and treat based on results.       Relevant Orders   VITAMIN D 25 Hydroxy (Vit-D Deficiency, Fractures)   Elevated TSH    TSH was elevated 3 years ago at 8. Will check thyroid panel and TPO antibodies.       Relevant Orders   Thyroid Peroxidase Antibodies (TPO) (REFL)   Thyroid Panel With TSH   Anxiety and depression    Chronic, stable. Continue fluoxetine  40mg  daily and hydroxyzine 25mg  as needed. She was seeing a therapist, however this was when she was going to [facility] and would like to establish with someone new. Referral placed today. Refills sent to the pharmacy.       Relevant Medications   FLUoxetine (PROZAC) 40 MG capsule   hydrOXYzine (ATARAX) 25 MG tablet   Other Relevant Orders   Ambulatory referral to Psychology   Other Visit Diagnoses     Cervical cancer screening       Referral placed to GYN.   Relevant Orders   Ambulatory referral to Gynecology   Immunization due       HPV #1 given today   Relevant Orders   HPV 9-valent vaccine,Recombinat (Completed)        Follow up plan: Return in about 3 months (around 02/11/2023) for CPE.  Christain Niznik A Jonia Oakey

## 2022-11-12 LAB — ANA W/REFLEX: Anti Nuclear Antibody (ANA): NEGATIVE

## 2022-11-14 LAB — THYROID PANEL WITH TSH
Free Thyroxine Index: 2.3 (ref 1.4–3.8)
T3 Uptake: 31 % (ref 22–35)
T4, Total: 7.3 ug/dL (ref 5.1–11.9)
TSH: 1.64 m[IU]/L

## 2022-11-14 LAB — THYROID PEROXIDASE ANTIBODIES (TPO) (REFL): Thyroperoxidase Ab SerPl-aCnc: <1 [IU]/mL (ref <9)

## 2022-11-24 ENCOUNTER — Ambulatory Visit (INDEPENDENT_AMBULATORY_CARE_PROVIDER_SITE_OTHER): Payer: Managed Care, Other (non HMO) | Admitting: Nurse Practitioner

## 2022-11-24 ENCOUNTER — Encounter: Payer: Self-pay | Admitting: Nurse Practitioner

## 2022-11-24 VITALS — BP 116/70 | HR 94 | Temp 97.4°F | Ht 64.0 in | Wt 144.4 lb

## 2022-11-24 DIAGNOSIS — R000 Tachycardia, unspecified: Secondary | ICD-10-CM

## 2022-11-24 DIAGNOSIS — E559 Vitamin D deficiency, unspecified: Secondary | ICD-10-CM | POA: Diagnosis not present

## 2022-11-24 DIAGNOSIS — Z3009 Encounter for other general counseling and advice on contraception: Secondary | ICD-10-CM

## 2022-11-24 DIAGNOSIS — R42 Dizziness and giddiness: Secondary | ICD-10-CM

## 2022-11-24 NOTE — Assessment & Plan Note (Signed)
Her recent labs show slightly low Vitamin D levels. Despite taking a multivitamin for about a month, she often forgets. We will add a Vitamin D supplement of 2000 units daily in addition to the multivitamin.

## 2022-11-24 NOTE — Progress Notes (Signed)
Established Patient Office Visit  Subjective   Patient ID: Ana Church, female    DOB: 09/08/2000  Age: 22 y.o. MRN: [id number]  Chief Complaint  Patient presents with   Follow-up    C/o still having rapid HR.  Also wants to discuss getting fallopian tubes removed.     HPI  Discussed the use of AI scribe software for clinical note transcription with the patient, who gave verbal consent to proceed.  History of Present Illness   The patient, with a history of recurrent episodes of dizziness, tachycardia, and vomiting, presents after a recent episode yesterday. She describes the episodes as sudden, with symptoms including a sensation of 'vision tunneling,' tachycardia, and vomiting. The episodes are severe enough to necessitate calling 911 and have resulted in missed work. The patient reports that the episodes are increasing in frequency and are unpredictable, causing significant distress. The most recent episode occurred after getting up from bed and eating a small meal, lasting approximately 5-10 minutes. She states the Zio monitor fell off and she is waiting for another one. She has an appointment with cardiology.   In addition to these episodes, the patient expresses a desire for sterilization, stating a firm decision not to have children. She has not previously used any birth control methods and has not seen a gynecologist.        ROS See pertinent positives and negatives per HPI.    Objective:     BP 116/70   Pulse 94   Temp (!) 97.4 F (36.3 C) (Temporal)   Ht 5\' 4"  (1.626 m)   Wt 144 lb 6.4 oz (65.5 kg)   LMP 10/24/2022 (Approximate)   SpO2 100%   BMI 24.79 kg/m    Physical Exam Vitals and nursing note reviewed.  Constitutional:      General: She is not in acute distress.    Appearance: Normal appearance.  HENT:     Head: Normocephalic.  Eyes:     Conjunctiva/sclera: Conjunctivae normal.  Cardiovascular:     Rate and Rhythm: Normal rate and regular  rhythm.     Pulses: Normal pulses.     Heart sounds: Normal heart sounds.  Pulmonary:     Effort: Pulmonary effort is normal.     Breath sounds: Normal breath sounds.  Musculoskeletal:     Cervical back: Normal range of motion.  Skin:    General: Skin is warm.  Neurological:     General: No focal deficit present.     Mental Status: She is alert and oriented to person, place, and time.  Psychiatric:        Mood and Affect: Mood normal.        Behavior: Behavior normal.        Thought Content: Thought content normal.        Judgment: Judgment normal.      Assessment & Plan:   Problem List Items Addressed This Visit       Other   Tachycardia - Primary    She experiences recurrent episodes of dizziness, tachycardia, and vomiting without chest pain, with an increase in frequency that impacts her work. Despite wearing a heart monitor, it fell off, and she awaits a replacement, with a cardiology appointment scheduled for December. We will continue monitoring symptoms to report to the cardiologist in December and order compression socks to potentially alleviate symptoms. Will fill out forms needed for work.  She is also interested in a referral to neurology, referral placed today.  Vitamin D insufficiency    Her recent labs show slightly low Vitamin D levels. Despite taking a multivitamin for about a month, she often forgets. We will add a Vitamin D supplement of 2000 units daily in addition to the multivitamin.      Other Visit Diagnoses     Lightheaded       She is currently doing a cardiology work-up and is also interested in a referral to neurology. Referral placed today.   Relevant Orders   Ambulatory referral to Neurology   Birth control counseling       She has decided to pursue sterilization for birth control, having never used other methods. Referral placed to GYN   Relevant Orders   Ambulatory referral to Gynecology      Return if symptoms worsen or fail to  improve.    Gerre Scull, NP

## 2022-11-24 NOTE — Patient Instructions (Signed)
It was great to see you!  I'm placing a referral to GYN and neurology. They will call to schedule an appointment.   Let's follow-up if symptoms worsen or any concerns.   Take care,  Rodman Pickle, NP

## 2022-11-24 NOTE — Assessment & Plan Note (Signed)
She experiences recurrent episodes of dizziness, tachycardia, and vomiting without chest pain, with an increase in frequency that impacts her work. Despite wearing a heart monitor, it fell off, and she awaits a replacement, with a cardiology appointment scheduled for December. We will continue monitoring symptoms to report to the cardiologist in December and order compression socks to potentially alleviate symptoms. Will fill out forms needed for work.  She is also interested in a referral to neurology, referral placed today.

## 2022-12-06 ENCOUNTER — Encounter: Payer: Self-pay | Admitting: Nurse Practitioner

## 2022-12-07 NOTE — Telephone Encounter (Signed)
Patient returned call and would like for form to be faxed and will stop by the office on Monday to sign forms and pick up a copy.

## 2022-12-07 NOTE — Telephone Encounter (Signed)
LVM for patient to return call.

## 2022-12-19 ENCOUNTER — Ambulatory Visit: Payer: Managed Care, Other (non HMO) | Admitting: Internal Medicine

## 2022-12-28 ENCOUNTER — Encounter: Payer: Self-pay | Admitting: Nurse Practitioner

## 2022-12-28 NOTE — Telephone Encounter (Signed)
Lvmtcb and sent mychart message to schedule an in person or virtual appt.

## 2022-12-30 ENCOUNTER — Telehealth (INDEPENDENT_AMBULATORY_CARE_PROVIDER_SITE_OTHER): Payer: Managed Care, Other (non HMO) | Admitting: Nurse Practitioner

## 2022-12-30 ENCOUNTER — Encounter: Payer: Self-pay | Admitting: Nurse Practitioner

## 2022-12-30 VITALS — Ht 64.0 in | Wt 145.0 lb

## 2022-12-30 DIAGNOSIS — F32A Depression, unspecified: Secondary | ICD-10-CM | POA: Diagnosis not present

## 2022-12-30 DIAGNOSIS — R110 Nausea: Secondary | ICD-10-CM | POA: Diagnosis not present

## 2022-12-30 DIAGNOSIS — F419 Anxiety disorder, unspecified: Secondary | ICD-10-CM

## 2022-12-30 MED ORDER — SERTRALINE HCL 50 MG PO TABS: 50.0000 mg | ORAL_TABLET | Freq: Every day | ORAL | 1 refills | Status: DC

## 2022-12-30 MED ORDER — ONDANSETRON HCL 4 MG PO TABS: 4.0000 mg | ORAL_TABLET | Freq: Three times a day (TID) | ORAL | 1 refills | Status: DC | PRN

## 2022-12-30 NOTE — Assessment & Plan Note (Signed)
She states that the fluoxetine is not helping like it was in the past, she has experienced increased depressive symptoms, including thoughts of self-harm and suicidal ideation. She reports decreased interest in activities, sleep disturbances, and feelings of sadness, with a recent episode of suicidal ideation that included a plan but no attempt. She has support from her partner and feels safe with them. We will discontinue Fluoxetine and start Sertraline (Zoloft) 50mg  daily, monitoring for side effects, including potential worsening of suicidal thoughts. A follow-up in 2 weeks will assess her response to the medication change. We will also provide information for 24-hour behavioral health urgent care for immediate assistance if needed. Urgent referral placed to psychiatry. She denies current SI and is able to contract for safety.

## 2022-12-30 NOTE — Progress Notes (Signed)
[facility] [address] [place] [address] Dept: [phone] Dept Fax: [phone]  Virtual Video Visit  I connected with Evlyn Courier on 12/30/22 at  3:40 PM EST by a video enabled telemedicine application and verified that I am speaking with the correct person using two identifiers.  Location patient: Home Location provider: Clinic Persons participating in the virtual visit: Patient; Rodman Pickle, NP; Malena Peer, CMA  I discussed the limitations of evaluation and management by telemedicine and the availability of in person appointments. The patient expressed understanding and agreed to proceed.  Chief Complaint  Patient presents with   Medication Management    SUBJECTIVE:  HPI: Ana Church is a 22 y.o. female who presents to follow-up on anxiety and depression.   Discussed the use of AI scribe software for clinical note transcription with the patient, who gave verbal consent to proceed.  History of Present Illness   The patient, with a history of depression managed with fluoxetine, presents with worsening depressive symptoms after a brief period of medication noncompliance due to a lapse in care. She reports feeling 'down,' experiencing crying spells, and having thoughts of self-harm, including cutting and suicidal ideation. These symptoms have been affecting her sleep and motivation, even for activities she typically enjoys. She had a specific incident a few days ago where she contemplated overdosing on ibuprofen but was stopped by her partner. She also reports intermittent nausea of unclear etiology. She currently denies SI. She states that she had binged drank alcohol that day as well. She has not heard from her therapy referral last month.        12/30/2022    3:41 PM 11/11/2022    2:26 PM  Depression screen PHQ 2/9  Decreased Interest 2 1  Down, Depressed, Hopeless 1 0  PHQ - 2 Score 3 1   Altered sleeping 1 2  Tired, decreased energy 2 2  Change in appetite 0 2  Feeling bad or failure about yourself  1 1  Trouble concentrating 1 1  Moving slowly or fidgety/restless 0 1  Suicidal thoughts 1 0  PHQ-9 Score 9 10  Difficult doing work/chores Somewhat difficult Extremely dIfficult      12/30/2022    3:43 PM 11/11/2022    2:26 PM  GAD 7 : Generalized Anxiety Score  Nervous, Anxious, on Edge 1 1  Control/stop worrying 1 1  Worry too much - different things 2 1  Trouble relaxing 1 1  Restless 0 1  Easily annoyed or irritable 0 0  Afraid - awful might happen 0 0  Total GAD 7 Score 5 5  Anxiety Difficulty Somewhat difficult Somewhat difficult    Patient Active Problem List   Diagnosis Date Noted   Nausea 12/30/2022   Tachycardia 11/11/2022   Fatigue 11/11/2022   Vitamin D insufficiency 11/11/2022   Elevated TSH 11/11/2022   Anxiety and depression 11/11/2022    No past surgical history on file.  Family History  Problem Relation Age of Onset   Hypertension Father    Diabetes Father     Social History   Tobacco Use   Smoking status: Never   Smokeless tobacco: Never  Vaping Use   Vaping status: Never Used  Substance Use Topics   Alcohol use: Yes    Alcohol/week: 4.0 standard drinks of alcohol    Types: 4 Shots of liquor per week    Comment: occassionally   Drug use: Never  Current Outpatient Medications:    hydrOXYzine (ATARAX) 25 MG tablet, Take 1 tablet (25 mg total) by mouth every 8 (eight) hours as needed for anxiety., Disp: 30 tablet, Rfl: 0   ondansetron (ZOFRAN) 4 MG tablet, Take 1 tablet (4 mg total) by mouth every 8 (eight) hours as needed for nausea or vomiting., Disp: 30 tablet, Rfl: 1   sertraline (ZOLOFT) 50 MG tablet, Take 1 tablet (50 mg total) by mouth daily., Disp: 30 tablet, Rfl: 1   ondansetron (ZOFRAN-ODT) 4 MG disintegrating tablet, Take 1 tablet (4 mg total) by mouth every 8 (eight) hours as needed for nausea or vomiting.  (Patient not taking: Reported on 12/30/2022), Disp: 12 tablet, Rfl: 0  No Known Allergies  ROS: See pertinent positives and negatives per HPI.  OBSERVATIONS/OBJECTIVE:  VITALS per patient if applicable: Today's Vitals   12/30/22 1540  Weight: 145 lb (65.8 kg)  Height: 5\' 4"  (1.626 m)   Body mass index is 24.89 kg/m.    GENERAL: Alert and oriented. Appears well and in no acute distress.  HEENT: Atraumatic. Conjunctiva clear. No obvious abnormalities on inspection of external nose and ears.  NECK: Normal movements of the head and neck.  LUNGS: On inspection, no signs of respiratory distress. Breathing rate appears normal. No obvious gross SOB, gasping or wheezing, and no conversational dyspnea.  CV: No obvious cyanosis.  MS: Moves all visible extremities without noticeable abnormality.  PSYCH/NEURO: Pleasant and cooperative. Speech intact, normal affect. Communicative.   ASSESSMENT AND PLAN:  Problem List Items Addressed This Visit       Other   Anxiety and depression - Primary   She states that the fluoxetine is not helping like it was in the past, she has experienced increased depressive symptoms, including thoughts of self-harm and suicidal ideation. She reports decreased interest in activities, sleep disturbances, and feelings of sadness, with a recent episode of suicidal ideation that included a plan but no attempt. She has support from her partner and feels safe with them. We will discontinue Fluoxetine and start Sertraline (Zoloft) 50mg  daily, monitoring for side effects, including potential worsening of suicidal thoughts. A follow-up in 2 weeks will assess her response to the medication change. We will also provide information for 24-hour behavioral health urgent care for immediate assistance if needed. Urgent referral placed to psychiatry. She denies current SI and is able to contract for safety.       Relevant Medications   sertraline (ZOLOFT) 50 MG tablet   Other  Relevant Orders   Ambulatory referral to Psychiatry   Nausea   She reports intermittent, non-specific nausea without vomiting or associated abdominal pain. We will prescribe Ondansetron (Zofran) 4mg  as needed for nausea, with caution regarding potential for constipation.         I discussed the assessment and treatment plan with the patient. The patient was provided an opportunity to ask questions and all were answered. The patient agreed with the plan and demonstrated an understanding of the instructions.   The patient was advised to call back or seek an in-person evaluation if the symptoms worsen or if the condition fails to improve as anticipated.   Gerre Scull, NP

## 2022-12-30 NOTE — Patient Instructions (Signed)
It was great to see you!  Stop the fluoxetine and start sertraline (zoloft) 1 tablet daily. Do not take these on the same day when switching.   The [facility] is open 24/7 and is a walk-in urgent care  [place] [address], [place], [place] [address] [phone]  I am going to follow-up on the therapy referral and will reach out as soon as I have more information.   Let's follow-up in 2 weeks, sooner if you have concerns.  If a referral was placed today, you will be contacted for an appointment. Please note that routine referrals can sometimes take up to 3-4 weeks to process. Please call our office if you haven't heard anything after this time frame.  Take care,  Rodman Pickle, NP

## 2022-12-30 NOTE — Assessment & Plan Note (Signed)
She reports intermittent, non-specific nausea without vomiting or associated abdominal pain. We will prescribe Ondansetron (Zofran) 4mg  as needed for nausea, with caution regarding potential for constipation.

## 2023-01-02 NOTE — Progress Notes (Signed)
Lvmtcb and sent message needing a two week f/up.

## 2023-01-02 NOTE — Progress Notes (Signed)
Lvmtcb and sent message to schedule a two week f/up.

## 2023-01-20 ENCOUNTER — Ambulatory Visit: Payer: Managed Care, Other (non HMO) | Admitting: Neurology

## 2023-02-08 ENCOUNTER — Ambulatory Visit (INDEPENDENT_AMBULATORY_CARE_PROVIDER_SITE_OTHER): Payer: Medicaid Other | Admitting: Obstetrics and Gynecology

## 2023-02-08 ENCOUNTER — Encounter: Payer: Self-pay | Admitting: Obstetrics and Gynecology

## 2023-02-08 ENCOUNTER — Other Ambulatory Visit: Payer: Self-pay

## 2023-02-08 VITALS — BP 124/80 | HR 98

## 2023-02-08 DIAGNOSIS — Z3009 Encounter for other general counseling and advice on contraception: Secondary | ICD-10-CM | POA: Diagnosis not present

## 2023-02-08 NOTE — Progress Notes (Deleted)
   GYNECOLOGY OFFICE VISIT NOTE  History:   Ana Church is a 23 y.o. No obstetric history on file. here today for ***.   She denies any abnormal vaginal discharge, bleeding, pelvic pain or other concerns.     Past Medical History:  Diagnosis Date   Anxiety    Depression     No past surgical history on file.  The following portions of the patient's history were reviewed and updated as appropriate: allergies, current medications, past family history, past medical history, past social history, past surgical history and problem list.   Health Maintenance:   Normal pap and negative HRHPV on ***.   No results found for: "DIAGPAP"   Normal mammogram on ***.   Review of Systems:  Pertinent items noted in HPI and remainder of comprehensive ROS otherwise negative.  Physical Exam:  BP 124/80   Pulse 98   LMP 01/10/2023 (Approximate)  CONSTITUTIONAL: Well-developed, well-nourished female in no acute distress.  HEENT:  Normocephalic, atraumatic. External right and left ear normal. No scleral icterus.  NECK: Normal range of motion, supple, no masses noted on observation SKIN: No rash noted. Not diaphoretic. No erythema. No pallor. MUSCULOSKELETAL: Normal range of motion. No edema noted. NEUROLOGIC: Alert and oriented to person, place, and time. Normal muscle tone coordination. No cranial nerve deficit noted. PSYCHIATRIC: Normal mood and affect. Normal behavior. Normal judgment and thought content.  CARDIOVASCULAR: Normal heart rate noted RESPIRATORY: Effort and breath sounds normal, no problems with respiration noted ABDOMEN: No masses noted. No other overt distention noted.    PELVIC: {Blank single:19197::"Deferred","Normal appearing external genitalia; normal urethral meatus; normal appearing vaginal mucosa and cervix.  No abnormal discharge noted.  Normal uterine size, no other palpable masses, no uterine or adnexal tenderness. Performed in the presence of a chaperone"}  Labs  and Imaging No results found for this or any previous visit (from the past week). No results found.  Assessment and Plan:   1. Unwanted fertility (Primary) ***    Ana Church was seen today for sterilization.  Diagnoses and all orders for this visit:  Unwanted fertility     No orders of the defined types were placed in this encounter.    Routine preventative health maintenance measures emphasized. Please refer to After Visit Summary for other counseling recommendations.   No follow-ups on file.  Milas Hock, MD, FACOG Obstetrician & Gynecologist, [facility] for [facility], [facility]

## 2023-02-08 NOTE — Progress Notes (Signed)
   GYNECOLOGY OFFICE VISIT NOTE  History:   Dannya Pitkin is a 23 y.o. G0P0000 here today for discussion of salpingectomy. She does not ever want children and has a fear of childbirth/pregnancy. She would like something permanent for birth control.    If for some reason she changed her mind, she understands she would only be able to get pregnant via IVF or have children via adoption.    Past Medical History:  Diagnosis Date   Anxiety    Depression     History reviewed. No pertinent surgical history.  The following portions of the patient's history were reviewed and updated as appropriate: allergies, current medications, past family history, past medical history, past social history, past surgical history and problem list.  Review of Systems:  Pertinent items noted in HPI and remainder of comprehensive ROS otherwise negative.  Physical Exam:  BP 124/80   Pulse 98   LMP 01/10/2023 (Approximate)  CONSTITUTIONAL: Well-developed, well-nourished female in no acute distress.  HEENT:  Normocephalic, atraumatic. External right and left ear normal. No scleral icterus.  NECK: Normal range of motion, supple, no masses noted on observation SKIN: No rash noted. Not diaphoretic. No erythema. No pallor. MUSCULOSKELETAL: Normal range of motion. No edema noted. NEUROLOGIC: Alert and oriented to person, place, and time. Normal muscle tone coordination. No cranial nerve deficit noted. PSYCHIATRIC: Normal mood and affect. Normal behavior. Normal judgment and thought content.  PELVIC: Deferred  Labs and Imaging No results found for this or any previous visit (from the past week). No results found.  Assessment and Plan:   1. Unwanted fertility (Primary) - She desires permanent sterilization. Discussed alternatives including LARC options.  - Risks of surgery include but are not limited to: bleeding, infection, injury to surrounding organs/tissues (i.e. bowel/bladder/ureters), need for additional  procedures, wound complications, hospital re-admission, and conversion to open surgery, VTE - Reviewed restrictions and recovery following surgery - Medicaid papers signed.   Routine preventative health maintenance measures emphasized. Please refer to After Visit Summary for other counseling recommendations.   Return if symptoms worsen or fail to improve.  Milas Hock, MD, FACOG Obstetrician & Gynecologist, [facility] for [facility], [facility]

## 2023-02-13 ENCOUNTER — Encounter: Payer: Self-pay | Admitting: *Deleted

## 2023-02-13 ENCOUNTER — Encounter: Payer: Managed Care, Other (non HMO) | Admitting: Nurse Practitioner

## 2023-02-14 ENCOUNTER — Encounter: Payer: Self-pay | Admitting: Neurology

## 2023-02-14 ENCOUNTER — Ambulatory Visit: Payer: Medicaid Other | Admitting: Neurology

## 2023-02-14 VITALS — BP 124/84 | HR 102 | Ht 64.0 in

## 2023-02-14 DIAGNOSIS — R42 Dizziness and giddiness: Secondary | ICD-10-CM

## 2023-02-14 DIAGNOSIS — R000 Tachycardia, unspecified: Secondary | ICD-10-CM | POA: Diagnosis not present

## 2023-02-14 DIAGNOSIS — R55 Syncope and collapse: Secondary | ICD-10-CM

## 2023-02-14 NOTE — Patient Instructions (Signed)
Continue to follow up with PCP  Follow up with cardiology to obtain heart monitor  Follow up with psychiatry for management of anxiety and depression  Increase fluid intake, consider adding electrolyte packet into your water  Return as needed

## 2023-02-14 NOTE — Progress Notes (Signed)
 [facility]  PATIENT: Ana Church DOB: 08/12/2000  REQUESTING CLINICIAN: Nedra Tinnie LABOR, NP HISTORY FROM: Patient  REASON FOR VISIT: Tachycardia, Presyncope    HISTORICAL  CHIEF COMPLAINT:  Chief Complaint  Patient presents with   New Patient (Initial Visit)    Rm13, ALONE, ORTHOSTATIC BP COMPLETED, NP internal referral for lightheaded: paraesthesia bilateral fingers/toes, nausea/vomiting, heart palpitations, fainting, frequent visits to er. It comes on all of a sudden and pt referred to it as an episode. Tunnel vision, muffled hearing, SOB, shaking, dilated pupils, elevated bp in the moment of episode    HISTORY OF PRESENT ILLNESS:  This is a 23 year old woman past medical history anxiety/depression who is presenting with multiple complaints.  Patient reports these episodes have been happening for the past several years, including palpitation, back and legs spasm, presyncopal episodes, nausea, vomiting, weakness and shortness of breath. She reports a history of passing out in 2021. She started with having heart palpitations, then tunnel vision, muffled hearing, she felt weak, and by the time she reached the ED she passed out. From then she continued to have presyncopal episodes where she will feel like she is going to pass out but never passed out.  She also reports nausea and vomiting that can be associated with this tachycardia and sometimes these episodes occur without the tachycardia. There is also reports of weakness and shortness of breath, again associated with tachycardia and sometimes they can occur alone alone.  Patient reports that stress make these symptoms worse.  Her last episode in September 2024, she was in her CNA class, she had tachycardia with numbness and tingling, then she started trembling, her face was flushed, she had dilated pupil.  She reports that she was conscious and was able to follow command.  She was taken to the ED.  This occurred  last September.  She did follow-up with her PCP, did have a cardiac monitor but after a few days to monitor fell, she is pending another visit with cardiology.   OTHER MEDICAL CONDITIONS: Anxiety/Depression    REVIEW OF SYSTEMS: Full 14 system review of systems performed and negative with exception of: As noted in the HPI   ALLERGIES: No Known Allergies  HOME MEDICATIONS: Outpatient Medications Prior to Visit  Medication Sig Dispense Refill   hydrOXYzine  (ATARAX ) 25 MG tablet Take 1 tablet (25 mg total) by mouth every 8 (eight) hours as needed for anxiety. 30 tablet 0   ondansetron  (ZOFRAN ) 4 MG tablet Take 1 tablet (4 mg total) by mouth every 8 (eight) hours as needed for nausea or vomiting. 30 tablet 1   sertraline  (ZOLOFT ) 50 MG tablet Take 1 tablet (50 mg total) by mouth daily. 30 tablet 1   No facility-administered medications prior to visit.    PAST MEDICAL HISTORY: Past Medical History:  Diagnosis Date   Anxiety    Depression     PAST SURGICAL HISTORY: History reviewed. No pertinent surgical history.  FAMILY HISTORY: Family History  Problem Relation Age of Onset   Hypertension Father    Diabetes Father     SOCIAL HISTORY: Social History   Socioeconomic History   Marital status: Single    Spouse name: Not on file   Number of children: Not on file   Years of education: Not on file   Highest education level: Bachelor's degree (e.g., BA, AB, BS)  Occupational History   Not on file  Tobacco Use   Smoking status: Never   Smokeless tobacco: Never  Vaping Use   Vaping status: Never Used  Substance and Sexual Activity   Alcohol use: Yes    Alcohol/week: 4.0 standard drinks of alcohol    Types: 4 Shots of liquor per week    Comment: occassionally   Drug use: Never   Sexual activity: Yes    Birth control/protection: Condom, None  Other Topics Concern   Not on file  Social History Narrative   Not on file   Social Drivers of Health   Financial Resource  Strain: Medium Risk (11/11/2022)   Overall Financial Resource Strain (CARDIA)    Difficulty of Paying Living Expenses: Somewhat hard  Food Insecurity: Food Insecurity Present (11/11/2022)   Hunger Vital Sign    Worried About Running Out of Food in the Last Year: Sometimes true    Ran Out of Food in the Last Year: Patient declined  Transportation Needs: Patient Declined (11/11/2022)   PRAPARE - Administrator, Civil Service (Medical): Patient declined    Lack of Transportation (Non-Medical): Patient declined  Recent Concern: Transportation Needs - Unmet Transportation Needs (10/13/2022)   Received from [employer]    In the past 12 months, has lack of reliable transportation kept you from medical appointments, meetings, work or from getting things needed for daily living? : Yes  Physical Activity: Unknown (11/11/2022)   Exercise Vital Sign    Days of Exercise per Week: Patient declined    Minutes of Exercise per Session: Not on file  Stress: Patient Declined (11/11/2022)   [employer] of Occupational Health - Occupational Stress Questionnaire    Feeling of Stress : Patient declined  Social Connections: Unknown (11/11/2022)   Social Connection and Isolation Panel [NHANES]    Frequency of Communication with Friends and Family: Once a week    Frequency of Social Gatherings with Friends and Family: Patient declined    Attends Religious Services: More than 4 times per year    Active Member of [employer] or Organizations: No    Attends Engineer, Structural: Not on file    Marital Status: Living with partner  Intimate Partner Violence: Not on file    PHYSICAL EXAM  GENERAL EXAM/CONSTITUTIONAL: Vitals:  Vitals:   02/14/23 0835 02/14/23 0836 02/14/23 0838  BP: 126/78 127/89 124/84  Pulse: (!) 111 (!) 107 (!) 102  SpO2: 95% 98% 98%  Height:  5' 4 (1.626 m)    Body mass index is 24.89 kg/m. Wt Readings from Last 3 Encounters:  12/30/22 145 lb (65.8  kg)  11/24/22 144 lb 6.4 oz (65.5 kg)  11/11/22 147 lb (66.7 kg)   Patient is in no distress; well developed, nourished and groomed; neck is supple  MUSCULOSKELETAL: Gait, strength, tone, movements noted in Neurologic exam below  NEUROLOGIC: MENTAL STATUS:      No data to display         awake, alert, oriented to person, place and time recent and remote memory intact normal attention and concentration language fluent, comprehension intact, naming intact fund of knowledge appropriate  CRANIAL NERVE:  2nd, 3rd, 4th, 6th - Visual fields full to confrontation, extraocular muscles intact, no nystagmus 5th - facial sensation symmetric 7th - facial strength symmetric 8th - hearing intact 9th - palate elevates symmetrically, uvula midline 11th - shoulder shrug symmetric 12th - tongue protrusion midline  MOTOR:  normal bulk and tone, full strength in the BUE, BLE  SENSORY:  normal and symmetric to light touch  COORDINATION:  finger-nose-finger,  fine finger movements normal  REFLEXES:  deep tendon reflexes present and symmetric  GAIT/STATION:  normal   DIAGNOSTIC DATA (LABS, IMAGING, TESTING) - I reviewed patient records, labs, notes, testing and imaging myself where available.  Lab Results  Component Value Date   WBC 6.9 11/06/2022   HGB 14.1 11/06/2022   HCT 41.4 11/06/2022   MCV 96.5 11/06/2022   PLT 283 11/06/2022      Component Value Date/Time   NA 137 11/06/2022 1938   K 3.7 11/06/2022 1938   CL 106 11/06/2022 1938   CO2 18 (L) 11/06/2022 1938   GLUCOSE 83 11/06/2022 1938   BUN 11 11/06/2022 1938   CREATININE 0.60 11/06/2022 1938   CALCIUM 9.0 11/06/2022 1938   PROT 7.5 11/18/2021 0030   ALBUMIN 4.2 11/18/2021 0030   AST 37 11/18/2021 0030   ALT 18 11/18/2021 0030   ALKPHOS 51 11/18/2021 0030   BILITOT 0.6 11/18/2021 0030   GFRNONAA >60 11/06/2022 1938   Lab Results  Component Value Date   CHOL 163 11/19/2021   HDL 59 11/19/2021   LDLCALC  88 11/19/2021   TRIG 78 11/19/2021   CHOLHDL 2.8 11/19/2021   Lab Results  Component Value Date   HGBA1C 5.0 11/19/2021   Lab Results  Component Value Date   VITAMINB12 377 11/11/2022   Lab Results  Component Value Date   TSH 1.64 11/11/2022      ASSESSMENT AND PLAN  23 y.o. year old female with anxiety/depression who is presenting with multiple complaints including syncope, presyncopal episodes, tachycardia, nausea, vomiting, shortness of breath.  Her neurological examination is nonfocal.  She does have a history of anxiety/depression, suspect that her symptoms might be related to underlying anxiety but cannot rule out POTS.  She is pending a cardiology visit.  Advised her to obtain a cardiac monitor, and follow-up with psychiatry.  My suspicion for dysautonomia is low.  Return as needed.   1. Postural dizziness with presyncope   2. Tachycardia      Patient Instructions  Continue to follow up with PCP  Follow up with cardiology to obtain heart monitor  Follow up with psychiatry for management of anxiety and depression  Increase fluid intake, consider adding electrolyte packet into your water  Return as needed   No orders of the defined types were placed in this encounter.   No orders of the defined types were placed in this encounter.   Return if symptoms worsen or fail to improve.    Pastor Falling, MD 02/14/2023, 7:47 PM  [facility] [address] [place], [place] [address] [phone]

## 2023-02-26 ENCOUNTER — Other Ambulatory Visit: Payer: Self-pay | Admitting: Nurse Practitioner

## 2023-02-28 ENCOUNTER — Ambulatory Visit: Payer: Medicaid Other | Admitting: Internal Medicine

## 2023-03-08 ENCOUNTER — Encounter: Payer: Self-pay | Admitting: Obstetrics and Gynecology

## 2023-03-09 NOTE — Progress Notes (Deleted)
 There were no vitals taken for this visit.   Subjective:    Patient ID: Ana Church, female    DOB: May 12, 2000, 23 y.o.   MRN: [id number]  CC: No chief complaint on file.   HPI: Ana Church is a 23 y.o. female presenting on 03/13/2023 for comprehensive medical examination. Current medical complaints include:{Blank single:19197::"none","***"}  She currently lives with: Menopausal Symptoms: {Blank single:19197::"yes","no"}  Depression and Anxiety Screen done today and results listed below:     02/08/2023    4:42 PM 12/30/2022    3:41 PM 11/11/2022    2:26 PM  Depression screen PHQ 2/9  Decreased Interest 1 2 1   Down, Depressed, Hopeless 1 1 0  PHQ - 2 Score 2 3 1   Altered sleeping 0 1 2  Tired, decreased energy 0 2 2  Change in appetite 0 0 2  Feeling bad or failure about yourself  0 1 1  Trouble concentrating 0 1 1  Moving slowly or fidgety/restless 0 0 1  Suicidal thoughts 0 1 0  PHQ-9 Score 2 9 10   Difficult doing work/chores  Somewhat difficult Extremely dIfficult      02/08/2023    4:42 PM 12/30/2022    3:43 PM 11/11/2022    2:26 PM  GAD 7 : Generalized Anxiety Score  Nervous, Anxious, on Edge 1 1 1   Control/stop worrying 1 1 1   Worry too much - different things 1 2 1   Trouble relaxing 1 1 1   Restless 0 0 1  Easily annoyed or irritable 0 0 0  Afraid - awful might happen 1 0 0  Total GAD 7 Score 5 5 5   Anxiety Difficulty  Somewhat difficult Somewhat difficult    The patient {has/does not have:19849} a history of falls. I {did/did not:19850} complete a risk assessment for falls. A plan of care for falls {was/was not:19852} documented.   Past Medical History:  Past Medical History:  Diagnosis Date   Anxiety    Depression     Surgical History:  No past surgical history on file.  Medications:  Current Outpatient Medications on File Prior to Visit  Medication Sig   hydrOXYzine (ATARAX) 25 MG tablet Take 1 tablet (25 mg total) by mouth every 8  (eight) hours as needed for anxiety.   ondansetron (ZOFRAN) 4 MG tablet Take 1 tablet (4 mg total) by mouth every 8 (eight) hours as needed for nausea or vomiting.   sertraline (ZOLOFT) 50 MG tablet TAKE 1 TABLET(50 MG) BY MOUTH DAILY   No current facility-administered medications on file prior to visit.    Allergies:  No Known Allergies  Social History:  Social History   Socioeconomic History   Marital status: Single    Spouse name: Not on file   Number of children: Not on file   Years of education: Not on file   Highest education level: Bachelor's degree (e.g., BA, AB, BS)  Occupational History   Not on file  Tobacco Use   Smoking status: Never   Smokeless tobacco: Never  Vaping Use   Vaping status: Never Used  Substance and Sexual Activity   Alcohol use: Yes    Alcohol/week: 4.0 standard drinks of alcohol    Types: 4 Shots of liquor per week    Comment: occassionally   Drug use: Never   Sexual activity: Yes    Birth control/protection: Condom, None  Other Topics Concern   Not on file  Social History Narrative   Not on file  Social Drivers of Health   Financial Resource Strain: Medium Risk (11/11/2022)   Overall Financial Resource Strain (CARDIA)    Difficulty of Paying Living Expenses: Somewhat hard  Food Insecurity: Food Insecurity Present (11/11/2022)   Hunger Vital Sign    Worried About Running Out of Food in the Last Year: Sometimes true    Ran Out of Food in the Last Year: Patient declined  Transportation Needs: Patient Declined (11/11/2022)   PRAPARE - Administrator, Civil Service (Medical): Patient declined    Lack of Transportation (Non-Medical): Patient declined  Recent Concern: Transportation Needs - Unmet Transportation Needs (10/13/2022)   Received from [employer]    In the past 12 months, has lack of reliable transportation kept you from medical appointments, meetings, work or from getting things needed for daily  living? : Yes  Physical Activity: Unknown (11/11/2022)   Exercise Vital Sign    Days of Exercise per Week: Patient declined    Minutes of Exercise per Session: Not on file  Stress: Patient Declined (11/11/2022)   [employer] of Occupational Health - Occupational Stress Questionnaire    Feeling of Stress : Patient declined  Social Connections: Unknown (11/11/2022)   Social Connection and Isolation Panel [NHANES]    Frequency of Communication with Friends and Family: Once a week    Frequency of Social Gatherings with Friends and Family: Patient declined    Attends Religious Services: More than 4 times per year    Active Member of [employer] or Organizations: No    Attends Engineer, structural: Not on file    Marital Status: Living with partner  Intimate Partner Violence: Not on file   Social History   Tobacco Use  Smoking Status Never  Smokeless Tobacco Never   Social History   Substance and Sexual Activity  Alcohol Use Yes   Alcohol/week: 4.0 standard drinks of alcohol   Types: 4 Shots of liquor per week   Comment: occassionally    Family History:  Family History  Problem Relation Age of Onset   Hypertension Father    Diabetes Father     Past medical history, surgical history, medications, allergies, family history and social history reviewed with patient today and changes made to appropriate areas of the chart.   ROS All other ROS negative except what is listed above and in the HPI.      Objective:    There were no vitals taken for this visit.  Wt Readings from Last 3 Encounters:  12/30/22 145 lb (65.8 kg)  11/24/22 144 lb 6.4 oz (65.5 kg)  11/11/22 147 lb (66.7 kg)    Physical Exam  Results for orders placed or performed in visit on 11/11/22  ANA w/Reflex   Collection Time: 11/11/22  2:24 PM  Result Value Ref Range   Anti Nuclear Antibody (ANA) Negative Negative  VITAMIN D 25 Hydroxy (Vit-D Deficiency, Fractures)   Collection Time: 11/11/22  2:24  PM  Result Value Ref Range   VITD 21.74 (L) 30.00 - 100.00 ng/mL  Vitamin B12   Collection Time: 11/11/22  2:24 PM  Result Value Ref Range   Vitamin B-12 377 211 - 911 pg/mL  Thyroid Peroxidase Antibodies (TPO) (REFL)   Collection Time: 11/11/22  2:24 PM  Result Value Ref Range   Thyroperoxidase Ab SerPl-aCnc <1 <9 IU/mL  Thyroid Panel With TSH   Collection Time: 11/11/22  2:24 PM  Result Value Ref Range   T3 Uptake 31  22 - 35 %   T4, Total 7.3 5.1 - 11.9 mcg/dL   Free Thyroxine Index 2.3 1.4 - 3.8   TSH 1.64 mIU/L      Assessment & Plan:   Problem List Items Addressed This Visit   None    Follow up plan: No follow-ups on file.   LABORATORY TESTING:  - Pap smear: {Blank single:19197::"pap done","not applicable","up to date","done elsewhere"}  IMMUNIZATIONS:   - Tdap: Tetanus vaccination status reviewed: last tetanus booster within 10 years. - Influenza: {Blank single:19197::"Up to date","Administered today","Postponed to flu season","Declined","Given elsewhere"} - Pneumovax: Not applicable - Prevnar: Not applicable - HPV: {Blank single:19197::"Up to date","Administered today","Not applicable","Declined","Given elsewhere"} - Shingrix vaccine: Not applicable  SCREENING: -Mammogram: Not applicable  - Colonoscopy: Not applicable  - Bone Density: Not applicable   PATIENT COUNSELING:   Advised to take 1 mg of folate supplement per day if capable of pregnancy.   Sexuality: Discussed sexually transmitted diseases, partner selection, use of condoms, avoidance of unintended pregnancy  and contraceptive alternatives.   Advised to avoid cigarette smoking.  I discussed with the patient that most people either abstain from alcohol or drink within safe limits (<=14/week and <=4 drinks/occasion for males, <=7/weeks and <= 3 drinks/occasion for females) and that the risk for alcohol disorders and other health effects rises proportionally with the number of drinks per week and how  often a drinker exceeds daily limits.  Discussed cessation/primary prevention of drug use and availability of treatment for abuse.   Diet: Encouraged to adjust caloric intake to maintain  or achieve ideal body weight, to reduce intake of dietary saturated fat and total fat, to limit sodium intake by avoiding high sodium foods and not adding table salt, and to maintain adequate dietary potassium and calcium preferably from fresh fruits, vegetables, and low-fat dairy products.    stressed the importance of regular exercise  Injury prevention: Discussed safety belts, safety helmets, smoke detector, smoking near bedding or upholstery.   Dental health: Discussed importance of regular tooth brushing, flossing, and dental visits.    NEXT PREVENTATIVE PHYSICAL DUE IN 1 YEAR. No follow-ups on file.  Cyncere Ruhe A Apolo Cutshaw

## 2023-03-13 ENCOUNTER — Encounter: Payer: Managed Care, Other (non HMO) | Admitting: Nurse Practitioner

## 2023-03-13 ENCOUNTER — Telehealth: Payer: Self-pay | Admitting: Nurse Practitioner

## 2023-03-13 NOTE — Telephone Encounter (Unsigned)
 Copied from CRM [id number]. Topic: Appointments - Appointment Cancel/Reschedule >> Mar 13, 2023  9:51 AM Sim Boast F wrote: Patient called to cancel appointment today due to inactive insurance, rescheduled to 04/14/23

## 2023-03-16 ENCOUNTER — Other Ambulatory Visit: Payer: Self-pay | Admitting: Medical Genetics

## 2023-03-17 NOTE — Telephone Encounter (Signed)
 1st missed visit 03/13/2023, letter sent via [facility]

## 2023-03-17 NOTE — Telephone Encounter (Signed)
 Noted.

## 2023-03-22 ENCOUNTER — Other Ambulatory Visit (HOSPITAL_COMMUNITY): Payer: Self-pay

## 2023-03-22 ENCOUNTER — Encounter: Payer: Self-pay | Admitting: Nurse Practitioner

## 2023-03-28 ENCOUNTER — Other Ambulatory Visit (HOSPITAL_COMMUNITY): Payer: Self-pay

## 2023-04-07 ENCOUNTER — Telehealth: Payer: Self-pay

## 2023-04-07 NOTE — Telephone Encounter (Signed)
 Reached out to patient to advise Dr. Para March is available on 05/30/22 for surgery. I left a voicemail asking patient to call me back to schedule at [phone]

## 2023-04-12 ENCOUNTER — Telehealth: Payer: Self-pay

## 2023-04-12 NOTE — Telephone Encounter (Signed)
 Patient called back to schedule surgery w/ Dr. Para March on 05/30/23 @[facility] @7 :30 am. I provided patient w/ pre-op instructions and surgery details. Written details will be sent to patient's Mychart acct.

## 2023-04-14 ENCOUNTER — Encounter: Admitting: Nurse Practitioner

## 2023-05-01 ENCOUNTER — Other Ambulatory Visit: Payer: Self-pay | Admitting: Nurse Practitioner

## 2023-05-02 NOTE — Telephone Encounter (Signed)
 Requesting: SERTRALINE  50MG  TABLETS  Last Visit: 11/24/2022 Next Visit: Visit date not found Last Refill: 02/27/2023  Please Advise

## 2023-05-04 ENCOUNTER — Ambulatory Visit: Payer: Medicaid Other | Admitting: Cardiology

## 2023-05-04 ENCOUNTER — Telehealth: Payer: Self-pay | Admitting: Obstetrics and Gynecology

## 2023-05-08 ENCOUNTER — Other Ambulatory Visit: Payer: Self-pay | Admitting: Nurse Practitioner

## 2023-05-08 MED ORDER — SERTRALINE HCL 50 MG PO TABS: 50.0000 mg | ORAL_TABLET | Freq: Every day | ORAL | 0 refills | Status: DC

## 2023-05-08 NOTE — Telephone Encounter (Signed)
 I called patient and left her a message to return call and schedule an appointment to be seen.   I was unable to send Rx for a 30 day supply to pharmacy.

## 2023-05-08 NOTE — Telephone Encounter (Signed)
 Copied from CRM [id number]. Topic: Clinical - Medication Refill >> May 08, 2023  2:38 PM Annelle Kiel wrote: Most Recent Primary Care Visit:  Provider: Rheba Cedar A  Department: [facility]  Visit Type: MYCHART VIDEO VISIT  Date: 12/30/2022  Medication: sertraline  (ZOLOFT ) 50 MG table  Has the patient contacted their pharmacy? Yes (Agent: If no, request that the patient contact the pharmacy for the refill. If patient does not wish to contact the pharmacy document the reason why and proceed with request.) (Agent: If yes, when and what did the pharmacy advise?)  Is this the correct pharmacy for this prescription? Yes If no, delete pharmacy and type the correct one.  This is the patient's preferred pharmacy:  [id number] Jonette Nestle, [place] - [address] AT [facility] [address] [place] [address] Phone: [phone] Fax: [phone]   Has the prescription been filled recently? No  Is the patient out of the medication? Yes  Has the patient been seen for an appointment in the last year OR does the patient have an upcoming appointment? Yes  Can we respond through MyChart? Yes  Agent: Please be advised that Rx refills may take up to 3 business days. We ask that you follow-up with your pharmacy.

## 2023-05-08 NOTE — Addendum Note (Signed)
 Addended by: Rockford Churches Y on: 05/08/2023 05:09 PM   Modules accepted: Orders

## 2023-05-08 NOTE — Telephone Encounter (Signed)
 Requesting: sertraline  (ZOLOFT ) 50 MG tablet  Last Visit: 11/24/2022 Next Visit: Visit date not found Last Refill: 05/02/2023  Please Advise

## 2023-05-09 NOTE — Telephone Encounter (Signed)
 I called patient and left her a message to return call and schedule an appointment. I also sent patient a message via Mychart.

## 2023-05-22 ENCOUNTER — Encounter: Payer: Self-pay | Admitting: Obstetrics and Gynecology

## 2023-05-30 ENCOUNTER — Ambulatory Visit (HOSPITAL_COMMUNITY): Admission: RE | Admit: 2023-05-30 | Payer: Self-pay | Source: Home / Self Care | Admitting: Obstetrics and Gynecology

## 2023-05-30 DIAGNOSIS — Z3009 Encounter for other general counseling and advice on contraception: Secondary | ICD-10-CM

## 2023-05-30 SURGERY — SALPINGECTOMY, BILATERAL, LAPAROSCOPIC
Anesthesia: Choice | Laterality: Bilateral

## 2023-06-29 NOTE — Progress Notes (Signed)
 Ana Church is a 23 y.o.  with  reports that she has never smoked. She has never used smokeless tobacco. with  Active Ambulatory Problems    Diagnosis Date Noted  . Paronychia of great toe, right   . Ingrown nail of great toe of right foot 02/20/2015  . Paronychia of great toe, left 03/20/2015  . MDD (major depressive disorder), recurrent episode, severe    (CMD) 11/18/2021   Resolved Ambulatory Problems    Diagnosis Date Noted  . No Resolved Ambulatory Problems   Past Medical History:  Diagnosis Date  . Paronychia    who presents today at Urgent Care for  Chief Complaint  Patient presents with  . Fall    Pt reported ETOH last night and reported falling. Is unable to remember what caused the fall. Denies taking medication today.       History of Present Illness The patient is a 23 year old female who presents with a facial injury after a fall last night.  She reports consuming alcohol prior to the incident and has no recollection of the events leading up to the fall. She has not taken any medication today. She experienced epistaxis following the fall and rates her current pain level as 2 out of 10. She is seeking a work note for the next few days.      Patient has no co-morbidities  or medications that might increase the risk for developing a severe infection     reports that she has never smoked. She has never used smokeless tobacco.  Review of Systems  Review of systems is otherwise negative except as noted in the HPI and Assessment/MDM   Physical Exam  BP 127/82 (BP Location: Left arm, Patient Position: Sitting)   Pulse 106   Temp 97.9 F (36.6 C) (Tympanic)   Resp 16   Ht 1.626 m (5' 4)   Wt 68 kg (150 lb)   LMP 06/24/2023 (Exact Date)   SpO2 100%   BMI 25.75 kg/m   Constitutional:      General: Patient is not in acute distress.    Appearance: Normal appearance.  Neurological:     General: No focal deficit present.     Mental Status: alert and  oriented to person, place, and time.  HENT:     Eyes:     Conjunctiva/sclera: Conjunctivae normal. Pharynx normal    There is no associated anterior cervical lymphadenopathy    Pupils: Pupils are equal, round, and reactive to light.     TM's normal with no visible effusion  Cardiovascular:     Heart sounds: Normal heart sounds. No murmur heard.    No Lower extremity edema noted Pulmonary:     Effort: Pulmonary effort is normal. No respiratory distress.     Breath sounds: No wheezing, rhonchi or rales.  Abdominal:     General: Bowel sounds are normal. There is no distension.     Tenderness: There is no abdominal tenderness.  Musculoskeletal:        General: Normal range of motion.     Neck:  No rigidity.  Skin:    General: Skin is warm and dry.  Psychiatric:        Mood and Affect: Mood normal.   XR Facial Bones 1-2 Views  Final Result by Debby Catarina Dibble, MD (06/19 1650)  XR FACIAL BONES 1-2 VIEWS, 06/29/2023 4:32 PM    INDICATION: Headache, unspecified \ R51.9 Headache, unspecified     COMPARISON: None  IMPRESSION:    No acute displaced facial fracture identified. The paranasal sinuses and   mastoid air cells are grossly clear.              DIAGNOSIS/PLAN     1. Contusion of face, initial encounter  ibuprofen (MOTRIN) 800 mg tablet    2. Face pain  XR Facial Bones 1-2 Views      Assessment & Plan Initial Assessment: 23 year old female with facial injury after a fall last night. Nosebleed reported. Pain scale 2/10.  Differential Diagnosis: - Nasal fracture: Considered due to nosebleed. Facial bone x-ray conducted. - Facial contusion: Likely due to fall. Ibuprofen prescribed for swelling and pain.  ED Course: - Facial bone x-ray performed. - X-ray read by me, no fractures found. - Ibuprofen 800 mg prescribed, sent to pharmacy. - Work note provided, excused until Monday.  Final Assessment: Facial bone x-ray showed no fractures.  Ibuprofen prescribed for swelling and pain. Work note provided.  Clinical Impression: - Facial contusion - Nasal injury  Disposition: Discharge: Home. No fractures found, pain managed with ibuprofen. Return if condition worsens.  Portions of this note were created using the aid of voice recognition Dragon/DAX dictation software.   We discussed risks and side effects of medications, and also discussed red flags which would warrant immediate follow-up.   Urgent Care Disposition:  Home Care

## 2023-07-11 ENCOUNTER — Telehealth (INDEPENDENT_AMBULATORY_CARE_PROVIDER_SITE_OTHER): Admitting: Nurse Practitioner

## 2023-07-11 ENCOUNTER — Encounter: Payer: Self-pay | Admitting: Nurse Practitioner

## 2023-07-11 DIAGNOSIS — N926 Irregular menstruation, unspecified: Secondary | ICD-10-CM

## 2023-07-11 DIAGNOSIS — F32A Depression, unspecified: Secondary | ICD-10-CM | POA: Diagnosis not present

## 2023-07-11 DIAGNOSIS — F419 Anxiety disorder, unspecified: Secondary | ICD-10-CM

## 2023-07-11 MED ORDER — ESCITALOPRAM OXALATE 10 MG PO TABS: 10.0000 mg | ORAL_TABLET | Freq: Every day | ORAL | 1 refills | Status: DC

## 2023-07-11 NOTE — Assessment & Plan Note (Addendum)
 Chronic, not controlled. Ana Church experiences increased depressive symptoms and occasional suicidal ideations. She currently denies SI. She has been off sertraline  due to insurance issues, however feels like it wasn't helping at all. Will start escitalopram 10mg  once daily, either in the morning or at night. Discuss potential side effects such as headache and nausea, which typically resolve in days. Instruct her to stop the medication and inform the provider if her mood worsens or suicidal thoughts increase. Send a referral for therapy to find an in-network therapist. Follow-up in 4-6 weeks.

## 2023-07-11 NOTE — Assessment & Plan Note (Signed)
 Ana Church reports light spotting after her period for the past 4-5 months. She is not on birth control and has not had a recent Pap smear. Recommend that she come in person to be evaluated for this further.

## 2023-07-11 NOTE — Patient Instructions (Signed)
 It was great to see you!  Start lexapro 1 tablet daily. You can take this in the morning or at night  I have placed a referral to a therapist .  [facility] is open 24/7 and is a walk-in urgent care if needed  [facility] [address], [place], [place] [address] [phone]  Let's look into your irregular menstrual bleeding in person next visit  Let's follow-up in 4 weeks, sooner if you have concerns.  If a referral was placed today, you will be contacted for an appointment. Please note that routine referrals can sometimes take up to 3-4 weeks to process. Please call our office if you haven't heard anything after this time frame.  Take care,  Tinnie Harada, NP

## 2023-07-11 NOTE — Progress Notes (Signed)
 [facility] [address] [place] [address] Dept: [phone] Dept Fax: [phone]  Virtual Video Visit  I connected with [name] on 07/11/23 at  9:00 AM EDT by a video enabled telemedicine application and verified that I am speaking with the correct person using two identifiers.  Location patient: Home Location provider: Clinic Persons participating in the virtual visit: Patient; Ana Harada, NP; Laymon Gladis Sharps, CMA  I discussed the limitations of evaluation and management by telemedicine and the availability of in person appointments. The patient expressed understanding and agreed to proceed.  Chief Complaint  Patient presents with   Medical Management of Chronic Issues    Discuss sertraline      SUBJECTIVE:  HPI:   Discussed the use of AI scribe software for clinical note transcription with the patient, who gave verbal consent to proceed.  History of Present Illness   Ana Church is a 23 year old with a history of depression who presents with worsening depression symptoms.  Over the past week, she has experienced crying spells, overeating, feelings of sadness and worthlessness, and occasional suicidal ideations triggered by certain events. There is a loss of interest in activities, lack of motivation, and feeling overwhelmed by tasks.  She was previously treated with sertraline , which was discontinued due to insurance issues and lack of perceived efficacy, with no change in symptoms upon discontinuation. Prior to sertraline , she was on fluoxetine , which initially worked well but became less effective over time. After a break in medication due to a lapse in refills, fluoxetine  was no longer effective. She is not currently on any antidepressants.  Irregular menstrual bleeding has occurred for the past four to five months, characterized by light spotting about a week after her period ends,  lasting for another week. The spotting is brownish or pinkish in color. She has not had a Pap smear and is not on hormonal birth control.         07/11/2023    8:52 AM 02/08/2023    4:42 PM 12/30/2022    3:41 PM 11/11/2022    2:26 PM  Depression screen PHQ 2/9  Decreased Interest 2 1 2 1   Down, Depressed, Hopeless 2 1 1  0  PHQ - 2 Score 4 2 3 1   Altered sleeping 1 0 1 2  Tired, decreased energy 1 0 2 2  Change in appetite 2 0 0 2  Feeling bad or failure about yourself  3 0 1 1  Trouble concentrating 2 0 1 1  Moving slowly or fidgety/restless 0 0 0 1  Suicidal thoughts 0 0 1 0  PHQ-9 Score 13 2 9 10   Difficult doing work/chores Somewhat difficult  Somewhat difficult Extremely dIfficult      02/08/2023    4:42 PM 12/30/2022    3:43 PM 11/11/2022    2:26 PM  GAD 7 : Generalized Anxiety Score  Nervous, Anxious, on Edge 1 1 1   Control/stop worrying 1 1 1   Worry too much - different things 1 2 1   Trouble relaxing 1 1 1   Restless 0 0 1  Easily annoyed or irritable 0 0 0  Afraid - awful might happen 1 0 0  Total GAD 7 Score 5 5 5   Anxiety Difficulty  Somewhat difficult Somewhat difficult    Patient Active Problem List   Diagnosis Date Noted   Irregular menstrual bleeding 07/11/2023   Nausea 12/30/2022   Tachycardia 11/11/2022   Fatigue 11/11/2022  Vitamin D  insufficiency 11/11/2022   Elevated TSH 11/11/2022   Anxiety and depression 11/11/2022    History reviewed. No pertinent surgical history.  Family History  Problem Relation Age of Onset   Hypertension Father    Diabetes Father     Social History   Tobacco Use   Smoking status: Never   Smokeless tobacco: Never  Vaping Use   Vaping status: Never Used  Substance Use Topics   Alcohol use: Yes    Alcohol/week: 4.0 standard drinks of alcohol    Types: 4 Shots of liquor per week    Comment: occassionally   Drug use: Never     Current Outpatient Medications:    escitalopram (LEXAPRO) 10 MG tablet, Take 1  tablet (10 mg total) by mouth daily., Disp: 30 tablet, Rfl: 1   hydrOXYzine  (ATARAX ) 25 MG tablet, Take 1 tablet (25 mg total) by mouth every 8 (eight) hours as needed for anxiety., Disp: 30 tablet, Rfl: 0   ondansetron  (ZOFRAN ) 4 MG tablet, Take 1 tablet (4 mg total) by mouth every 8 (eight) hours as needed for nausea or vomiting., Disp: 30 tablet, Rfl: 1  No Known Allergies  ROS: See pertinent positives and negatives per HPI.  OBSERVATIONS/OBJECTIVE:  VITALS per patient if applicable: There were no vitals filed for this visit. There is no height or weight on file to calculate BMI.    GENERAL: Alert and oriented. Appears well and in no acute distress.  LUNGS: No signs of respiratory distress.No obvious gross SOB, gasping or wheezing, and no conversational dyspnea.  PSYCH/NEURO: Pleasant and cooperative. No obvious depression or anxiety. Speech and thought processing grossly intact.  ASSESSMENT AND PLAN:  Problem List Items Addressed This Visit       Other   Anxiety and depression - Primary   Chronic, not controlled. Jamie experiences increased depressive symptoms and occasional suicidal ideations. She currently denies SI. She has been off sertraline  due to insurance issues, however feels like it wasn't helping at all. Will start escitalopram 10mg  once daily, either in the morning or at night. Discuss potential side effects such as headache and nausea, which typically resolve in days. Instruct her to stop the medication and inform the provider if her mood worsens or suicidal thoughts increase. Send a referral for therapy to find an in-network therapist. Follow-up in 4-6 weeks.       Relevant Medications   escitalopram (LEXAPRO) 10 MG tablet   Irregular menstrual bleeding   [name] reports light spotting after her period for the past 4-5 months. She is not on birth control and has not had a recent Pap smear. Recommend that she come in person to be evaluated for this further.         I discussed the assessment and treatment plan with the patient. The patient was provided an opportunity to ask questions and all were answered. The patient agreed with the plan and demonstrated an understanding of the instructions.   The patient was advised to call back or seek an in-person evaluation if the symptoms worsen or if the condition fails to improve as anticipated.   Ana DELENA Harada, NP

## 2023-08-17 ENCOUNTER — Telehealth: Payer: Self-pay

## 2023-08-17 ENCOUNTER — Other Ambulatory Visit: Payer: Self-pay | Admitting: Nurse Practitioner

## 2023-08-17 ENCOUNTER — Other Ambulatory Visit: Payer: Self-pay | Admitting: Obstetrics and Gynecology

## 2023-08-17 ENCOUNTER — Encounter: Payer: Self-pay | Admitting: Obstetrics and Gynecology

## 2023-08-17 DIAGNOSIS — Z302 Encounter for sterilization: Secondary | ICD-10-CM

## 2023-08-17 NOTE — Telephone Encounter (Signed)
 Spoke to patient about open referral to [facility]. Patient states want referral to stay open. Planning to reach out to them.

## 2023-08-17 NOTE — Telephone Encounter (Signed)
 Requesting: ESCITALOPRAM  10MG  TABLETS  Last Visit: 11/24/2022 Next Visit: Visit date not found Last Refill: 07/11/2023 patient to follow up in 4 weeks   Please Advise

## 2023-09-13 ENCOUNTER — Telehealth: Admitting: Family Medicine

## 2023-09-13 ENCOUNTER — Encounter: Payer: Self-pay | Admitting: *Deleted

## 2023-09-13 DIAGNOSIS — F419 Anxiety disorder, unspecified: Secondary | ICD-10-CM

## 2023-09-13 DIAGNOSIS — F32A Depression, unspecified: Secondary | ICD-10-CM | POA: Diagnosis not present

## 2023-09-13 MED ORDER — ESCITALOPRAM OXALATE 10 MG PO TABS: 10.0000 mg | ORAL_TABLET | Freq: Every day | ORAL | 0 refills | Status: DC

## 2023-09-13 MED ORDER — HYDROXYZINE PAMOATE 25 MG PO CAPS: 25.0000 mg | ORAL_CAPSULE | Freq: Three times a day (TID) | ORAL | 0 refills | Status: AC | PRN

## 2023-09-13 NOTE — Patient Instructions (Signed)

## 2023-09-13 NOTE — Progress Notes (Signed)
 Virtual Visit Consent   Kyley Solow, you are scheduled for a virtual visit with a [facility] provider today. Just as with appointments in the office, your consent must be obtained to participate. Your consent will be active for this visit and any virtual visit you may have with one of our providers in the next 365 days. If you have a MyChart account, a copy of this consent can be sent to you electronically.  As this is a virtual visit, video technology does not allow for your provider to perform a traditional examination. This may limit your provider's ability to fully assess your condition. If your provider identifies any concerns that need to be evaluated in person or the need to arrange testing (such as labs, EKG, etc.), we will make arrangements to do so. Although advances in technology are sophisticated, we cannot ensure that it will always work on either your end or our end. If the connection with a video visit is poor, the visit may have to be switched to a telephone visit. With either a video or telephone visit, we are not always able to ensure that we have a secure connection.  By engaging in this virtual visit, you consent to the provision of healthcare and authorize for your insurance to be billed (if applicable) for the services provided during this visit. Depending on your insurance coverage, you may receive a charge related to this service.  I need to obtain your verbal consent now. Are you willing to proceed with your visit today? Keyry Steinhilber has provided verbal consent on 09/13/2023 for a virtual visit (video or telephone). Loa Lamp, FNP  Date: 09/13/2023 7:45 PM   Virtual Visit via Video Note   I, Loa Lamp, connected with  Ana Church  ([id number], [birth year]) on 09/13/23 at  7:45 PM EDT by a video-enabled telemedicine application and verified that I am speaking with the correct person using two identifiers.  Location: Patient: Virtual Visit Location Patient:  Home Provider: Virtual Visit Location Provider: Home Office   I discussed the limitations of evaluation and management by telemedicine and the availability of in person appointments. The patient expressed understanding and agreed to proceed.    History of Present Illness: Ana Church is a 23 y.o. who identifies as a female who was assigned female at birth, and is being seen today for a refill on lexapro  and hydroxyzine  as she had to cancel her last apptmt. She will make another one. Denies problems. SABRA  HPI: HPI  Problems:  Patient Active Problem List   Diagnosis Date Noted   Irregular menstrual bleeding 07/11/2023   Nausea 12/30/2022   Tachycardia 11/11/2022   Fatigue 11/11/2022   Vitamin D  insufficiency 11/11/2022   Elevated TSH 11/11/2022   Anxiety and depression 11/11/2022    Allergies: No Known Allergies Medications:  Current Outpatient Medications:    escitalopram  (LEXAPRO ) 10 MG tablet, Take 1 tablet (10 mg total) by mouth daily., Disp: 30 tablet, Rfl: 0   hydrOXYzine  (VISTARIL ) 25 MG capsule, Take 1 capsule (25 mg total) by mouth every 8 (eight) hours as needed., Disp: 30 capsule, Rfl: 0   escitalopram  (LEXAPRO ) 10 MG tablet, TAKE 1 TABLET(10 MG) BY MOUTH DAILY, Disp: 30 tablet, Rfl: 0   hydrOXYzine  (ATARAX ) 25 MG tablet, Take 1 tablet (25 mg total) by mouth every 8 (eight) hours as needed for anxiety., Disp: 30 tablet, Rfl: 0   ondansetron  (ZOFRAN ) 4 MG tablet, Take 1 tablet (4 mg total) by mouth every 8 (eight)  hours as needed for nausea or vomiting., Disp: 30 tablet, Rfl: 1  Observations/Objective: Patient is well-developed, well-nourished in no acute distress.  Resting comfortably  at home.  Head is normocephalic, atraumatic.  No labored breathing.  Speech is clear and coherent with logical content.  Patient is alert and oriented at baseline.    Assessment and Plan: 1. Anxiety and depression (Primary)  F/U with pcp.   Follow Up Instructions: I discussed the  assessment and treatment plan with the patient. The patient was provided an opportunity to ask questions and all were answered. The patient agreed with the plan and demonstrated an understanding of the instructions.  A copy of instructions were sent to the patient via MyChart unless otherwise noted below.     The patient was advised to call back or seek an in-person evaluation if the symptoms worsen or if the condition fails to improve as anticipated.    Jaquay Posthumus, FNP

## 2023-09-14 ENCOUNTER — Ambulatory Visit: Admitting: Nurse Practitioner

## 2023-09-30 ENCOUNTER — Emergency Department (HOSPITAL_COMMUNITY)

## 2023-09-30 ENCOUNTER — Other Ambulatory Visit: Payer: Self-pay

## 2023-09-30 ENCOUNTER — Emergency Department (HOSPITAL_COMMUNITY)
Admission: EM | Admit: 2023-09-30 | Discharge: 2023-10-01 | Disposition: A | Discharge status: Home / Self Care | Attending: Emergency Medicine | Admitting: Emergency Medicine

## 2023-09-30 ENCOUNTER — Encounter (HOSPITAL_COMMUNITY): Payer: Self-pay | Admitting: *Deleted

## 2023-09-30 DIAGNOSIS — Z043 Encounter for examination and observation following other accident: Secondary | ICD-10-CM | POA: Diagnosis not present

## 2023-09-30 DIAGNOSIS — R55 Syncope and collapse: Secondary | ICD-10-CM | POA: Diagnosis present

## 2023-09-30 DIAGNOSIS — F10929 Alcohol use, unspecified with intoxication, unspecified: Secondary | ICD-10-CM | POA: Insufficient documentation

## 2023-09-30 DIAGNOSIS — Y908 Blood alcohol level of 240 mg/100 ml or more: Secondary | ICD-10-CM | POA: Diagnosis not present

## 2023-09-30 DIAGNOSIS — W19XXXA Unspecified fall, initial encounter: Secondary | ICD-10-CM

## 2023-09-30 LAB — CBC
HCT: 42.1 % (ref 36.0–46.0)
Hemoglobin: 14.6 g/dL (ref 12.0–15.0)
MCH: 33.3 pg (ref 26.0–34.0)
MCHC: 34.7 g/dL (ref 30.0–36.0)
MCV: 95.9 fL (ref 80.0–100.0)
Platelets: 339 K/uL (ref 150–400)
RBC: 4.39 MIL/uL (ref 3.87–5.11)
RDW: 12.1 % (ref 11.5–15.5)
WBC: 6.8 K/uL (ref 4.0–10.5)
nRBC: 0 % (ref 0.0–0.2)

## 2023-09-30 LAB — COMPREHENSIVE METABOLIC PANEL WITH GFR
ALT: 20 U/L (ref 0–44)
AST: 54 U/L — ABNORMAL HIGH (ref 15–41)
Albumin: 4.7 g/dL (ref 3.5–5.0)
Alkaline Phosphatase: 60 U/L (ref 38–126)
Anion gap: 15 (ref 5–15)
BUN: 6 mg/dL (ref 6–20)
CO2: 22 mmol/L (ref 22–32)
Calcium: 9 mg/dL (ref 8.9–10.3)
Chloride: 108 mmol/L (ref 98–111)
Creatinine, Ser: 0.68 mg/dL (ref 0.44–1.00)
GFR, Estimated: >60 mL/min (ref >60)
Glucose, Bld: 94 mg/dL (ref 70–99)
Potassium: 3.5 mmol/L (ref 3.5–5.1)
Sodium: 145 mmol/L (ref 135–145)
Total Bilirubin: 0.8 mg/dL (ref 0.0–1.2)
Total Protein: 8.5 g/dL — ABNORMAL HIGH (ref 6.5–8.1)

## 2023-09-30 LAB — ETHANOL: Alcohol, Ethyl (B): 324 mg/dL (ref <15)

## 2023-09-30 LAB — HCG, SERUM, QUALITATIVE: Preg, Serum: NEGATIVE

## 2023-09-30 MED ORDER — SODIUM CHLORIDE 0.9 % IV BOLUS
1000.0000 mL | Freq: Once | INTRAVENOUS | Status: AC
  Administered 2023-09-30: 1000 mL via INTRAVENOUS

## 2023-09-30 NOTE — Discharge Instructions (Signed)
 Make sure to follow with your primary care provider for further evaluation.  They can put in referral to cardiology if they feel necessary.  Return for new or worsening symptoms

## 2023-09-30 NOTE — ED Triage Notes (Signed)
 PT states 2 unwitnessed falls in the shower (boyfriend states he heard them but did not see them).  Pt states she doesn't know why she fell . . Ana Church been having problems with POTTs, even though it hasn't been diagnosed.  Bp 120/78 Hr 100 Cbg 110 O2 sats 100%  18 L ac

## 2023-09-30 NOTE — ED Provider Triage Note (Signed)
 Emergency Medicine Provider Triage Evaluation Note  Ana Church , a 23 y.o. female  was evaluated in triage.  Pt complains of fall.  Patient states that she was drinking earlier today and got in the shower and fell, unclear history due to patient being currently intoxicated.  Patient is in c-collar upon arrival.  Denies head pain or neck pain, denies visual disturbances.  Denies chest pain, abdominal pain, shortness of breath.  Patient was noted to be dry heaving in triage.  Patient mentions that she feels like she may have POTS however this has never been diagnosed, had 2 unwitnessed falls in the shower and boyfriend is who called EMS.  Review of Systems  Positive: Fall, alcohol use Negative: Head pain, neck pain, chest pain, shortness of breath  Physical Exam  BP 112/76 (BP Location: Right Arm)   Pulse 95   Temp 98 F (36.7 C) (Oral)   Resp 16   Ht 5' 4 (1.626 m)   Wt 68 kg   LMP 09/29/2023 (Exact Date)   SpO2 100%   BMI 25.75 kg/m  Gen:   Awake, no distress no distress Resp:  Normal effort  MSK:   Moves extremities witho normal effort ut difficulty moves extremities without difficulty Other:  Pupils reactive to light however not as responsive as expected, extraocular motion intact however hard to gauge nystagmus due to patient current intoxicated state and compliance with exam, patient able to stand under own power and ambulate without assistance, denies extremity pain, chest pain, abdominal pain  Medical Decision Making  Medically screening exam initiated at 7:42 PM.  Appropriate orders placed.  Ana Church was informed that the remainder of the evaluation will be completed by another provider, this initial triage assessment does not replace that evaluation, and the importance of remaining in the ED until their evaluation is complete.  Orders: CBC, CMP, CBG monitoring, pregnancy test, urinalysis, UDS, ethanol, CT cervical spine and CT head, EKG   Janetta Terrall FALCON,  PA-C 09/30/23 1945

## 2023-09-30 NOTE — ED Provider Notes (Signed)
 [place] EMERGENCY DEPARTMENT AT [facility] Provider Note   CSN: [id number] Arrival date & time: 09/30/23  1813    Patient presents with: Ana Church   Jerika Wales is a 23 y.o. female here for evaluation of fall.  States she has not had anything to eat or drink today.  Took a few shots of liquor.  She got into a hot shower and fell.  She is unsure if she passed out.  States she does have a history of POTS however has not had a formal diagnosis.  She is unsure if she hit her head.  She arrives in c-collar with EMS.  No headache, neck pain, chest pain, shortness of breath, vision changes, nausea, vomiting, abdominal pain, pain or swelling to upper and lower extremities.  She has not followed with cardiology for her prior syncopal episodes.  No personal or family history of arrhythmia, sudden cardiac death, HOCM.    HPI     Prior to Admission medications   Medication Sig Start Date End Date Taking? Authorizing Provider  escitalopram  (LEXAPRO ) 10 MG tablet TAKE 1 TABLET(10 MG) BY MOUTH DAILY 08/17/23   McElwee, Lauren A, NP  escitalopram  (LEXAPRO ) 10 MG tablet Take 1 tablet (10 mg total) by mouth daily. 09/13/23 10/13/23  Blair, Diane W, FNP  hydrOXYzine  (ATARAX ) 25 MG tablet Take 1 tablet (25 mg total) by mouth every 8 (eight) hours as needed for anxiety. 11/11/22   McElwee, Lauren A, NP  hydrOXYzine  (VISTARIL ) 25 MG capsule Take 1 capsule (25 mg total) by mouth every 8 (eight) hours as needed. 09/13/23   Blair, Diane W, FNP  ondansetron  (ZOFRAN ) 4 MG tablet Take 1 tablet (4 mg total) by mouth every 8 (eight) hours as needed for nausea or vomiting. 12/30/22   McElwee, Tinnie LABOR, NP    Allergies: Patient has no known allergies.    Review of Systems  Constitutional: Negative.   HENT: Negative.    Respiratory: Negative.    Cardiovascular: Negative.   Gastrointestinal: Negative.   Genitourinary: Negative.   Musculoskeletal: Negative.   Skin: Negative.   Neurological:  Positive for  syncope. Negative for dizziness, tremors, seizures, facial asymmetry, speech difficulty, weakness, light-headedness, numbness and headaches.  All other systems reviewed and are negative.   Updated Vital Signs BP 112/76 (BP Location: Right Arm)   Pulse 95   Temp 98 F (36.7 C) (Oral)   Resp 16   Ht 5' 4 (1.626 m)   Wt 68 kg   LMP 09/29/2023 (Exact Date)   SpO2 100%   BMI 25.75 kg/m   Physical Exam Vitals and nursing note reviewed.  Constitutional:      General: She is not in acute distress.    Appearance: She is well-developed. She is not ill-appearing, toxic-appearing or diaphoretic.  HENT:     Head: Normocephalic and atraumatic.     Nose: Nose normal.     Mouth/Throat:     Mouth: Mucous membranes are moist.  Eyes:     General: No allergic shiner, visual field deficit or scleral icterus.    Extraocular Movements: Extraocular movements intact.     Conjunctiva/sclera:     Right eye: Right conjunctiva is injected.     Left eye: Left conjunctiva is injected.     Pupils: Pupils are equal, round, and reactive to light.     Comments: Injected sclera bilaterally  Cardiovascular:     Rate and Rhythm: Normal rate.     Pulses: Normal pulses.  Radial pulses are 2+ on the right side and 2+ on the left side.       Dorsalis pedis pulses are 2+ on the right side and 2+ on the left side.     Heart sounds: Normal heart sounds.  Pulmonary:     Effort: Pulmonary effort is normal. No respiratory distress.     Breath sounds: Normal breath sounds.  Abdominal:     General: Bowel sounds are normal. There is no distension.     Palpations: Abdomen is soft.     Tenderness: There is no abdominal tenderness. There is no right CVA tenderness, left CVA tenderness, guarding or rebound.  Musculoskeletal:        General: No swelling, tenderness, deformity or signs of injury. Normal range of motion.     Cervical back: Normal range of motion.     Right lower leg: No edema.     Left lower leg:  No edema.  Skin:    General: Skin is warm and dry.     Capillary Refill: Capillary refill takes less than 2 seconds.  Neurological:     General: No focal deficit present.     Mental Status: She is alert.     Cranial Nerves: No cranial nerve deficit.     Sensory: No sensory deficit.     Motor: No weakness.     Gait: Gait normal.  Psychiatric:        Mood and Affect: Mood normal.    (all labs ordered are listed, but only abnormal results are displayed) Labs Reviewed  COMPREHENSIVE METABOLIC PANEL WITH GFR - Abnormal; Notable for the following components:      Result Value   Total Protein 8.5 (*)    AST 54 (*)    All other components within normal limits  ETHANOL - Abnormal; Notable for the following components:   Alcohol, Ethyl (B) 324 (*)    All other components within normal limits  CBC  HCG, SERUM, QUALITATIVE  URINALYSIS, ROUTINE W REFLEX MICROSCOPIC  RAPID URINE DRUG SCREEN, HOSP PERFORMED  CBG MONITORING, ED    EKG: EKG Interpretation Date/Time:  Saturday September 30 2023 19:39:47 EDT Ventricular Rate:  102 PR Interval:  148 QRS Duration:  102 QT Interval:  386 QTC Calculation: 503 R Axis:   124  Text Interpretation: Sinus tachycardia Right axis deviation Possible Right ventricular hypertrophy Abnormal ECG When compared with ECG of 07-Nov-2022 20:29, PREVIOUS ECG IS PRESENT when compared to prior, similar appearance with slightly longer QTc no STEMI Confirmed by Ginger Barefoot ([id number]) on 09/30/2023 8:20:30 PM  Radiology: DG Chest 2 View Result Date: 09/30/2023 EXAM: 2 VIEW(S) XRAY OF THE CHEST 09/30/2023 09:19:00 PM COMPARISON: 11/06/2022 CLINICAL HISTORY: Syncope. Per triage notes: PT states 2 unwitnessed falls in the shower (boyfriend states he heard them but did not see them). Pt states she doesn't know why she fell. I've been having problems with POTTs, even though it hasn't been diagnosed. FINDINGS: LUNGS AND PLEURA: No focal pulmonary opacity. No pulmonary  edema. No pleural effusion. No pneumothorax. HEART AND MEDIASTINUM: No acute abnormality of the cardiac and mediastinal silhouettes. BONES AND SOFT TISSUES: No acute osseous abnormality. IMPRESSION: 1. No acute abnormalities. Electronically signed by: Pinkie Pebbles MD 09/30/2023 09:23 PM EDT RP Workstation: [id number]   CT Head Wo Contrast Result Date: 09/30/2023 EXAM: CT HEAD AND CERVICAL SPINE 09/30/2023 08:20:00 PM TECHNIQUE: CT of the head and cervical spine was performed without the administration of intravenous contrast. Multiplanar reformatted images are  provided for review. Automated exposure control, iterative reconstruction, and/or weight based adjustment of the mA/kV was utilized to reduce the radiation dose to as low as reasonably achievable. COMPARISON: None available. CLINICAL HISTORY: Fall, possible head injury. Patient was not able to remove some jewelry for procedure. FINDINGS: CT HEAD BRAIN AND VENTRICLES: No acute intracranial hemorrhage. No mass effect or midline shift. No abnormal extra-axial fluid collection. No evidence of acute infarct. No hydrocephalus. ORBITS: No acute abnormality. SINUSES AND MASTOIDS: No acute abnormality. SOFT TISSUES AND SKULL: No acute skull fracture. No acute soft tissue abnormality. CT CERVICAL SPINE BONES AND ALIGNMENT: No acute fracture or traumatic malalignment. DEGENERATIVE CHANGES: No significant degenerative changes. SOFT TISSUES: No prevertebral soft tissue swelling. IMPRESSION: 1. No acute intracranial abnormality. 2. No acute fracture or traumatic malalignment of the cervical spine. Electronically signed by: Franky Stanford MD 09/30/2023 08:48 PM EDT RP Workstation: [id number]   CT Cervical Spine Wo Contrast Result Date: 09/30/2023 EXAM: CT HEAD AND CERVICAL SPINE 09/30/2023 08:20:00 PM TECHNIQUE: CT of the head and cervical spine was performed without the administration of intravenous contrast. Multiplanar reformatted images are provided for review.  Automated exposure control, iterative reconstruction, and/or weight based adjustment of the mA/kV was utilized to reduce the radiation dose to as low as reasonably achievable. COMPARISON: None available. CLINICAL HISTORY: Fall, possible head injury. Patient was not able to remove some jewelry for procedure. FINDINGS: CT HEAD BRAIN AND VENTRICLES: No acute intracranial hemorrhage. No mass effect or midline shift. No abnormal extra-axial fluid collection. No evidence of acute infarct. No hydrocephalus. ORBITS: No acute abnormality. SINUSES AND MASTOIDS: No acute abnormality. SOFT TISSUES AND SKULL: No acute skull fracture. No acute soft tissue abnormality. CT CERVICAL SPINE BONES AND ALIGNMENT: No acute fracture or traumatic malalignment. DEGENERATIVE CHANGES: No significant degenerative changes. SOFT TISSUES: No prevertebral soft tissue swelling. IMPRESSION: 1. No acute intracranial abnormality. 2. No acute fracture or traumatic malalignment of the cervical spine. Electronically signed by: Franky Stanford MD 09/30/2023 08:48 PM EDT RP Workstation: [id number]     Procedures   Medications Ordered in the ED  sodium chloride  0.9 % bolus 1,000 mL (1,000 mLs Intravenous New Bag/Given 09/30/23 2041)   Here for evaluation of fall versus syncope.  Had not had anything to eat or drink today subsequently started drinking alcohol this evening.  Was in the shower and had either a fall or a syncopal episode.  She is not entirely sure.  Significant other called EMS.  She states she has had syncope previously thought related to POTS however no official diagnosis.  Typically episodes occur when she goes from sitting to standing.  She has never been assessed by cardiology for this.  No family history of sudden cardiac death, HOCM, arrhythmia, long QT, Brugada.  Does appear intoxicated.  Labs and imaging personally viewed and interpreted:  CBC without leukocytosis, normal hemoglobin Metabolic panel AST 54 Pregnancy  neg Ethanol 324 EKG wo ischemic changes, QTC 503 Imaging without significant abnormality  Discussed results with patient, significant other in room.  She has been observed for 5-1/2 hours.  She is tolerating p.o. intake here.  Ambulatory.  Remains nonfocal neuroexam.  Will have her follow-up with PCP, return for new or worsening symptoms.  The patient has been appropriately medically screened and/or stabilized in the ED. I have low suspicion for any other emergent medical condition which would require further screening, evaluation or treatment in the ED or require inpatient management.  Patient is hemodynamically stable and in no acute  distress.  Patient able to ambulate in department prior to ED.  Evaluation does not show acute pathology that would require ongoing or additional emergent interventions while in the emergency department or further inpatient treatment.  I have discussed the diagnosis with the patient and answered all questions.  Pain is been managed while in the emergency department and patient has no further complaints prior to discharge.  Patient is comfortable with plan discussed in room and is stable for discharge at this time.  I have discussed strict return precautions for returning to the emergency department.  Patient was encouraged to follow-up with PCP/specialist refer to at discharge.                                   Medical Decision Making Amount and/or Complexity of Data Reviewed Independent Historian: friend External Data Reviewed: labs, radiology, ECG and notes. Labs: ordered. Decision-making details documented in ED Course. Radiology: ordered and independent interpretation performed. Decision-making details documented in ED Course. ECG/medicine tests: ordered and independent interpretation performed. Decision-making details documented in ED Course.  Risk Decision regarding hospitalization. Diagnosis or treatment significantly limited by social determinants of  health.        Final diagnoses:  Fall, initial encounter  Alcoholic intoxication with complication [facility])  Near syncope    ED Discharge Orders     None          Edie Einstein A, PA-C 09/30/23 2340    Tegeler, Lonni PARAS, MD 09/30/23 [phone]

## 2023-10-11 ENCOUNTER — Ambulatory Visit: Admitting: Nurse Practitioner

## 2023-10-11 ENCOUNTER — Telehealth (INDEPENDENT_AMBULATORY_CARE_PROVIDER_SITE_OTHER): Admitting: Nurse Practitioner

## 2023-10-11 ENCOUNTER — Encounter: Payer: Self-pay | Admitting: Nurse Practitioner

## 2023-10-11 VITALS — Ht 64.0 in | Wt 150.0 lb

## 2023-10-11 DIAGNOSIS — F419 Anxiety disorder, unspecified: Secondary | ICD-10-CM | POA: Diagnosis not present

## 2023-10-11 DIAGNOSIS — F32A Depression, unspecified: Secondary | ICD-10-CM

## 2023-10-11 DIAGNOSIS — Z23 Encounter for immunization: Secondary | ICD-10-CM

## 2023-10-11 MED ORDER — COVID-19 MRNA VACC (MODERNA) 50 MCG/0.5ML IM SUSY: 0.5000 mL | PREFILLED_SYRINGE | Freq: Once | INTRAMUSCULAR | 0 refills | Status: AC

## 2023-10-11 MED ORDER — ESCITALOPRAM OXALATE 10 MG PO TABS: 10.0000 mg | ORAL_TABLET | Freq: Every day | ORAL | 0 refills | Status: DC

## 2023-10-11 NOTE — Patient Instructions (Signed)
 It was great to see you!  Continue lexapro  10mg  daily  I have sent a prescription for the covid vaccine to the pharmacy  Let's follow-up in 3 months, sooner if you have concerns.  If a referral was placed today, you will be contacted for an appointment. Please note that routine referrals can sometimes take up to 3-4 weeks to process. Please call our office if you haven't heard anything after this time frame.  Take care,  Tinnie Harada, NP

## 2023-10-11 NOTE — Progress Notes (Signed)
 [facility] [address] [place] [address] Dept: [phone] Dept Fax: [phone]  Virtual Video Visit  I connected with Ana Church on 10/11/23 at  3:40 PM EDT by a video enabled telemedicine application and verified that I am speaking with the correct person using two identifiers.  Location patient: Home Location provider: Clinic Persons participating in the virtual visit: Patient; Tinnie Harada, NP; Laymon Gladis Sharps, CMA  I discussed the limitations of evaluation and management by telemedicine and the availability of in person appointments. The patient expressed understanding and agreed to proceed.  Chief Complaint  Patient presents with   Anxiety and depression    Follow up, Rx refill    SUBJECTIVE:  HPI:   Discussed the use of AI scribe software for clinical note transcription with the patient, who gave verbal consent to proceed.  History of Present Illness   Ana Church is a 23 year old with anxiety and depression who presents for medication management.  Anxiety and depression have improved over the past week. Lexapro  is effective in managing her symptoms. She experiences no frequent panic attacks, trouble sleeping, or crying spells.  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 The work environment is pleasant, and she finds the cases interesting.         10/11/2023    2:29 PM 07/11/2023    8:52 AM 02/08/2023    4:42 PM 12/30/2022    3:41 PM 11/11/2022    2:26 PM  Depression screen PHQ 2/9  Decreased Interest 0 2 1 2 1   Down, Depressed, Hopeless 0 2 1 1  0  PHQ - 2 Score 0 4 2 3 1   Altered sleeping 1 1 0 1 2  Tired, decreased energy 1 1 0 2 2  Change in appetite 1 2 0 0 2  Feeling bad or failure about yourself  1 3 0 1 1  Trouble concentrating 2 2 0 1 1  Moving slowly or fidgety/restless 0 0 0 0 1  Suicidal thoughts 0 0 0 1 0  PHQ-9 Score 6 13 2 9 10    Difficult doing work/chores Somewhat difficult Somewhat difficult  Somewhat difficult Extremely dIfficult        10/11/2023    2:31 PM 02/08/2023    4:42 PM 12/30/2022    3:43 PM 11/11/2022    2:26 PM  GAD 7 : Generalized Anxiety Score  Nervous, Anxious, on Edge 2 1 1 1   Control/stop worrying 1 1 1 1   Worry too much - different things 1 1 2 1   Trouble relaxing 0 1 1 1   Restless 1 0 0 1  Easily annoyed or irritable 0 0 0 0  Afraid - awful might happen 1 1 0 0  Total GAD 7 Score 6 5 5 5   Anxiety Difficulty Not difficult at all  Somewhat difficult Somewhat difficult     Patient Active Problem List   Diagnosis Date Noted   Irregular menstrual bleeding 07/11/2023   Nausea 12/30/2022   Tachycardia 11/11/2022   Fatigue 11/11/2022   Vitamin D  insufficiency 11/11/2022   Elevated TSH 11/11/2022   Anxiety and depression 11/11/2022    History reviewed. No pertinent surgical history.  Family History  Problem Relation Age of Onset   Hypertension Father    Diabetes Father     Social History   Tobacco Use   Smoking status: Never   Smokeless tobacco: Never  Vaping  Use   Vaping status: Never Used  Substance Use Topics   Alcohol use: Yes    Alcohol/week: 4.0 standard drinks of alcohol    Types: 4 Shots of liquor per week    Comment: occassionally   Drug use: Never     Current Outpatient Medications:    COVID-19 mRNA vaccine (SPIKEVAX) syringe, Inject 0.5 mLs into the muscle once for 1 dose., Disp: 0.5 mL, Rfl: 0   hydrOXYzine  (ATARAX ) 25 MG tablet, Take 1 tablet (25 mg total) by mouth every 8 (eight) hours as needed for anxiety., Disp: 30 tablet, Rfl: 0   hydrOXYzine  (VISTARIL ) 25 MG capsule, Take 1 capsule (25 mg total) by mouth every 8 (eight) hours as needed., Disp: 30 capsule, Rfl: 0   ondansetron  (ZOFRAN ) 4 MG tablet, Take 1 tablet (4 mg total) by mouth every 8 (eight) hours as needed for nausea or vomiting., Disp: 30 tablet, Rfl: 1   escitalopram  (LEXAPRO ) 10 MG  tablet, Take 1 tablet (10 mg total) by mouth daily., Disp: 90 tablet, Rfl: 0  No Known Allergies  ROS: See pertinent positives and negatives per HPI.  OBSERVATIONS/OBJECTIVE:  VITALS per patient if applicable: Today's Vitals   10/11/23 1428  Weight: 150 lb (68 kg)  Height: 5' 4 (1.626 m)   Body mass index is 25.75 kg/m.    GENERAL: Alert and oriented. Appears well and in no acute distress.  HEENT: Atraumatic. Conjunctiva clear. No obvious abnormalities on inspection of external nose and ears.  NECK: Normal movements of the head and neck.  LUNGS: On inspection, no signs of respiratory distress. Breathing rate appears normal. No obvious gross SOB, gasping or wheezing, and no conversational dyspnea.  CV: No obvious cyanosis.  MS: Moves all visible extremities without noticeable abnormality.  PSYCH/NEURO: Pleasant and cooperative. No obvious depression or anxiety. Speech and thought processing grossly intact.  ASSESSMENT AND PLAN:  Problem List Items Addressed This Visit       Other   Anxiety and depression - Primary   Chronic, stable. PHQ 9 has improved from a 13 to a 6 and GAD 7 is stable at 6. Continue lexapro  10mg  daily and hydroxyzine  25mg  TID prn anxiety. Follow-up in-person in 3 months.       Relevant Medications   escitalopram  (LEXAPRO ) 10 MG tablet   COVID-19 mRNA vaccine (SPIKEVAX) syringe   Other Visit Diagnoses       Immunization due       With history of depression, covid RX sent to pharmacy.   Relevant Medications   COVID-19 mRNA vaccine [facility]) syringe        I discussed the assessment and treatment plan with the patient. The patient was provided an opportunity to ask questions and all were answered. The patient agreed with the plan and demonstrated an understanding of the instructions.   The patient was advised to call back or seek an in-person evaluation if the symptoms worsen or if the condition fails to improve as anticipated.   Tinnie DELENA Harada, NP

## 2023-10-11 NOTE — Assessment & Plan Note (Signed)
 Chronic, stable. PHQ 9 has improved from a 13 to a 6 and GAD 7 is stable at 6. Continue lexapro  10mg  daily and hydroxyzine  25mg  TID prn anxiety. Follow-up in-person in 3 months.

## 2023-10-20 ENCOUNTER — Inpatient Hospital Stay: Admitting: Nurse Practitioner

## 2023-10-25 ENCOUNTER — Other Ambulatory Visit: Payer: Self-pay | Admitting: Medical Genetics

## 2023-10-25 DIAGNOSIS — Z006 Encounter for examination for normal comparison and control in clinical research program: Secondary | ICD-10-CM

## 2023-11-08 ENCOUNTER — Telehealth: Payer: Self-pay

## 2023-11-08 NOTE — Telephone Encounter (Signed)
 I called patient to notify her that Dr. Cleatus has an opening on 11/28/23 at 9 am. I left a voicemail requesting a call back.

## 2023-11-13 ENCOUNTER — Telehealth: Payer: Self-pay

## 2023-11-13 NOTE — Telephone Encounter (Signed)
 I called patient to schedule surgery w/ Dr. Erik on 11/27/23. I asked patient to call me back to schedule.

## 2023-11-22 ENCOUNTER — Encounter (HOSPITAL_COMMUNITY): Payer: Self-pay | Admitting: Obstetrics and Gynecology

## 2023-11-22 NOTE — Progress Notes (Signed)
 Spoke w/ via phone for pre-op interview--- Ana Church needs dos---- CBC, T&S and UPT per surgeon.          Church results------Current EKG in Epic dated 09/30/23 COVID test -----patient states asymptomatic no test needed Arrive at -------0530 NPO after MN NO Solid Food.   Pre-Surgery Ensure or G2:  Med rec completed Medications to take morning of surgery -----Lexapro  Diabetic medication -----  GLP1 agonist last dose: GLP1 instructions:  Patient instructed no nail polish to be worn day of surgery Patient instructed to bring photo id and insurance card day of surgery Patient aware to have Driver (ride ) / caregiver    for 24 hours after surgery - Partner Ana Church Patient Special Instructions ----- Pre-Op special Instructions -----  Patient verbalized understanding of instructions that were given at this phone interview. Patient denies chest pain, sob, fever, cough at the interview.

## 2023-11-27 NOTE — Anesthesia Preprocedure Evaluation (Signed)
 Anesthesia Evaluation  Patient identified by MRN, date of birth, ID band Patient awake    Reviewed: Allergy & Precautions, H&P , NPO status , Patient's Chart, lab work & pertinent test results  Airway Mallampati: II  TM Distance: >3 FB Neck ROM: Full    Dental no notable dental hx.    Pulmonary neg pulmonary ROS   Pulmonary exam normal breath sounds clear to auscultation       Cardiovascular (-) hypertension(-) angina (-) Past MI negative cardio ROS Normal cardiovascular exam Rhythm:Regular Rate:Normal     Neuro/Psych neg Seizures PSYCHIATRIC DISORDERS Anxiety Depression    negative neurological ROS     GI/Hepatic negative GI ROS, Neg liver ROS,neg GERD  ,,  Endo/Other  negative endocrine ROS    Renal/GU negative Renal ROS  negative genitourinary   Musculoskeletal negative musculoskeletal ROS (+)    Abdominal   Peds negative pediatric ROS (+)  Hematology negative hematology ROS (+)   Anesthesia Other Findings   Reproductive/Obstetrics negative OB ROS                              Anesthesia Physical Anesthesia Plan  ASA: 2  Anesthesia Plan: General   Post-op Pain Management:    Induction: Intravenous  PONV Risk Score and Plan: 3 and Ondansetron , Dexamethasone, Midazolam and Treatment may vary due to age or medical condition  Airway Management Planned: Oral ETT  Additional Equipment: None  Intra-op Plan:   Post-operative Plan: Extubation in OR  Informed Consent: I have reviewed the patients History and Physical, chart, labs and discussed the procedure including the risks, benefits and alternatives for the proposed anesthesia with the patient or authorized representative who has indicated his/her understanding and acceptance.     Dental advisory given  Plan Discussed with: CRNA  Anesthesia Plan Comments:          Anesthesia Quick Evaluation

## 2023-11-28 ENCOUNTER — Ambulatory Visit (HOSPITAL_COMMUNITY): Payer: Self-pay

## 2023-11-28 ENCOUNTER — Ambulatory Visit (HOSPITAL_COMMUNITY)
Admission: RE | Admit: 2023-11-28 | Discharge: 2023-11-28 | Disposition: A | Discharge status: Home / Self Care | Attending: Obstetrics and Gynecology | Admitting: Obstetrics and Gynecology

## 2023-11-28 ENCOUNTER — Encounter (HOSPITAL_COMMUNITY): Payer: Self-pay | Admitting: Obstetrics and Gynecology

## 2023-11-28 ENCOUNTER — Encounter (HOSPITAL_COMMUNITY)
Admission: RE | Disposition: A | Discharge status: Home / Self Care | Payer: Self-pay | Source: Home / Self Care | Attending: Obstetrics and Gynecology

## 2023-11-28 ENCOUNTER — Ambulatory Visit (HOSPITAL_BASED_OUTPATIENT_CLINIC_OR_DEPARTMENT_OTHER): Payer: Self-pay

## 2023-11-28 DIAGNOSIS — Z302 Encounter for sterilization: Secondary | ICD-10-CM | POA: Diagnosis not present

## 2023-11-28 DIAGNOSIS — F419 Anxiety disorder, unspecified: Secondary | ICD-10-CM | POA: Diagnosis not present

## 2023-11-28 DIAGNOSIS — F32A Depression, unspecified: Secondary | ICD-10-CM | POA: Insufficient documentation

## 2023-11-28 DIAGNOSIS — Z3009 Encounter for other general counseling and advice on contraception: Secondary | ICD-10-CM | POA: Insufficient documentation

## 2023-11-28 HISTORY — PX: LAPAROSCOPIC BILATERAL SALPINGECTOMY: SHX5889

## 2023-11-28 LAB — POCT PREGNANCY, URINE: Preg Test, Ur: NEGATIVE

## 2023-11-28 LAB — TYPE AND SCREEN
ABO/RH(D): O POS
Antibody Screen: NEGATIVE

## 2023-11-28 LAB — CBC
HCT: 40.1 % (ref 36.0–46.0)
Hemoglobin: 13.7 g/dL (ref 12.0–15.0)
MCH: 33.6 pg (ref 26.0–34.0)
MCHC: 34.2 g/dL (ref 30.0–36.0)
MCV: 98.3 fL (ref 80.0–100.0)
Platelets: 236 K/uL (ref 150–400)
RBC: 4.08 MIL/uL (ref 3.87–5.11)
RDW: 13 % (ref 11.5–15.5)
WBC: 8.2 K/uL (ref 4.0–10.5)
nRBC: 0 % (ref 0.0–0.2)

## 2023-11-28 LAB — ABO/RH: ABO/RH(D): O POS

## 2023-11-28 SURGERY — SALPINGECTOMY, BILATERAL, LAPAROSCOPIC
Anesthesia: General | Laterality: Bilateral

## 2023-11-28 MED ORDER — ACETAMINOPHEN 500 MG PO TABS
ORAL_TABLET | ORAL | Status: AC
  Filled 2023-11-28: qty 2

## 2023-11-28 MED ORDER — IBUPROFEN 800 MG PO TABS: 800.0000 mg | ORAL_TABLET | Freq: Three times a day (TID) | ORAL | 0 refills | Status: AC | PRN

## 2023-11-28 MED ORDER — DEXMEDETOMIDINE HCL IN NACL 80 MCG/20ML IV SOLN
INTRAVENOUS | Status: DC | PRN
  Administered 2023-11-28: 8 ug via INTRAVENOUS

## 2023-11-28 MED ORDER — FENTANYL CITRATE (PF) 100 MCG/2ML IJ SOLN
INTRAMUSCULAR | Status: AC
  Filled 2023-11-28: qty 2

## 2023-11-28 MED ORDER — OXYCODONE HCL 5 MG PO TABS
ORAL_TABLET | ORAL | Status: AC
  Filled 2023-11-28: qty 1

## 2023-11-28 MED ORDER — ACETAMINOPHEN 500 MG PO TABS
1000.0000 mg | ORAL_TABLET | ORAL | Status: AC
  Administered 2023-11-28: 1000 mg via ORAL

## 2023-11-28 MED ORDER — 0.9 % SODIUM CHLORIDE (POUR BTL) OPTIME
TOPICAL | Status: DC | PRN
  Administered 2023-11-28: 1000 mL

## 2023-11-28 MED ORDER — BUPIVACAINE HCL (PF) 0.25 % IJ SOLN
INTRAMUSCULAR | Status: DC | PRN
  Administered 2023-11-28: 10 mL

## 2023-11-28 MED ORDER — LACTATED RINGERS IV SOLN: INTRAVENOUS | Status: DC

## 2023-11-28 MED ORDER — OXYCODONE HCL 5 MG PO TABS: 5.0000 mg | ORAL_TABLET | ORAL | 0 refills | Status: DC | PRN

## 2023-11-28 MED ORDER — EPHEDRINE SULFATE (PRESSORS) 25 MG/5ML IV SOSY
PREFILLED_SYRINGE | INTRAVENOUS | Status: DC | PRN
  Administered 2023-11-28: 5 mg via INTRAVENOUS

## 2023-11-28 MED ORDER — ROCURONIUM BROMIDE 10 MG/ML (PF) SYRINGE
PREFILLED_SYRINGE | INTRAVENOUS | Status: AC
  Filled 2023-11-28: qty 10

## 2023-11-28 MED ORDER — ONDANSETRON HCL 4 MG/2ML IJ SOLN
INTRAMUSCULAR | Status: AC
  Filled 2023-11-28: qty 2

## 2023-11-28 MED ORDER — ROCURONIUM BROMIDE 10 MG/ML (PF) SYRINGE
PREFILLED_SYRINGE | INTRAVENOUS | Status: DC | PRN
  Administered 2023-11-28: 50 mg via INTRAVENOUS

## 2023-11-28 MED ORDER — LIDOCAINE 2% (20 MG/ML) 5 ML SYRINGE
INTRAMUSCULAR | Status: AC
  Filled 2023-11-28: qty 5

## 2023-11-28 MED ORDER — OXYCODONE HCL 5 MG/5ML PO SOLN: 5.0000 mg | Freq: Once | ORAL | Status: AC | PRN

## 2023-11-28 MED ORDER — ACETAMINOPHEN 10 MG/ML IV SOLN: 1000.0000 mg | Freq: Once | INTRAVENOUS | Status: DC | PRN

## 2023-11-28 MED ORDER — ONDANSETRON HCL 4 MG/2ML IJ SOLN
INTRAMUSCULAR | Status: DC | PRN
  Administered 2023-11-28: 4 mg via INTRAVENOUS

## 2023-11-28 MED ORDER — CHLORHEXIDINE GLUCONATE 0.12 % MT SOLN
15.0000 mL | Freq: Once | OROMUCOSAL | Status: AC
  Administered 2023-11-28: 15 mL via OROMUCOSAL

## 2023-11-28 MED ORDER — DEXAMETHASONE SOD PHOSPHATE PF 10 MG/ML IJ SOLN
INTRAMUSCULAR | Status: DC | PRN
Start: 2023-11-28 — End: 2023-11-28
  Administered 2023-11-28: 10 mg via INTRAVENOUS

## 2023-11-28 MED ORDER — MIDAZOLAM HCL 2 MG/2ML IJ SOLN
INTRAMUSCULAR | Status: AC
  Filled 2023-11-28: qty 2

## 2023-11-28 MED ORDER — PROPOFOL 10 MG/ML IV BOLUS
INTRAVENOUS | Status: AC
  Filled 2023-11-28: qty 20

## 2023-11-28 MED ORDER — OXYCODONE HCL 5 MG PO TABS
5.0000 mg | ORAL_TABLET | Freq: Once | ORAL | Status: AC | PRN
  Administered 2023-11-28: 5 mg via ORAL

## 2023-11-28 MED ORDER — FENTANYL CITRATE (PF) 250 MCG/5ML IJ SOLN
INTRAMUSCULAR | Status: DC | PRN
  Administered 2023-11-28: 100 ug via INTRAVENOUS

## 2023-11-28 MED ORDER — BUPIVACAINE HCL (PF) 0.25 % IJ SOLN
INTRAMUSCULAR | Status: AC
  Filled 2023-11-28: qty 30

## 2023-11-28 MED ORDER — SUGAMMADEX SODIUM 200 MG/2ML IV SOLN
INTRAVENOUS | Status: DC | PRN
  Administered 2023-11-28: 200 mg via INTRAVENOUS

## 2023-11-28 MED ORDER — PROPOFOL 10 MG/ML IV BOLUS
INTRAVENOUS | Status: DC | PRN
Start: 2023-11-28 — End: 2023-11-28
  Administered 2023-11-28: 50 mg via INTRAVENOUS
  Administered 2023-11-28: 150 mg via INTRAVENOUS

## 2023-11-28 MED ORDER — SCOPOLAMINE 1 MG/3DAYS TD PT72
MEDICATED_PATCH | TRANSDERMAL | Status: AC
  Filled 2023-11-28: qty 1

## 2023-11-28 MED ORDER — PHENYLEPHRINE 80 MCG/ML (10ML) SYRINGE FOR IV PUSH (FOR BLOOD PRESSURE SUPPORT)
PREFILLED_SYRINGE | INTRAVENOUS | Status: DC | PRN
  Administered 2023-11-28: 80 ug via INTRAVENOUS
  Administered 2023-11-28: 160 ug via INTRAVENOUS

## 2023-11-28 MED ORDER — MIDAZOLAM HCL (PF) 2 MG/2ML IJ SOLN
INTRAMUSCULAR | Status: DC | PRN
  Administered 2023-11-28: 2 mg via INTRAVENOUS

## 2023-11-28 MED ORDER — DROPERIDOL 2.5 MG/ML IJ SOLN: 0.6250 mg | Freq: Once | INTRAMUSCULAR | Status: DC | PRN

## 2023-11-28 MED ORDER — CHLORHEXIDINE GLUCONATE 0.12 % MT SOLN
OROMUCOSAL | Status: AC
  Filled 2023-11-28: qty 15

## 2023-11-28 MED ORDER — SCOPOLAMINE 1 MG/3DAYS TD PT72
1.0000 | MEDICATED_PATCH | TRANSDERMAL | Status: DC
  Administered 2023-11-28: 1 mg via TRANSDERMAL

## 2023-11-28 MED ORDER — ONDANSETRON 4 MG PO TBDP: 4.0000 mg | ORAL_TABLET | Freq: Four times a day (QID) | ORAL | 0 refills | Status: DC | PRN

## 2023-11-28 MED ORDER — POVIDONE-IODINE 10 % EX SWAB: 2.0000 | Freq: Once | CUTANEOUS | Status: DC

## 2023-11-28 MED ORDER — ORAL CARE MOUTH RINSE: 15.0000 mL | Freq: Once | OROMUCOSAL | Status: AC

## 2023-11-28 MED ORDER — ACETAMINOPHEN 500 MG PO TABS: 1000.0000 mg | ORAL_TABLET | Freq: Once | ORAL | Status: DC

## 2023-11-28 MED ORDER — FENTANYL CITRATE (PF) 100 MCG/2ML IJ SOLN
25.0000 ug | INTRAMUSCULAR | Status: DC | PRN
  Administered 2023-11-28 (×2): 25 ug via INTRAVENOUS

## 2023-11-28 MED ORDER — LIDOCAINE 2% (20 MG/ML) 5 ML SYRINGE
INTRAMUSCULAR | Status: DC | PRN
  Administered 2023-11-28: 60 mg via INTRAVENOUS

## 2023-11-28 SURGICAL SUPPLY — 31 items
BAG COUNTER SPONGE SURGICOUNT (BAG) IMPLANT
COVER MAYO STAND STRL (DRAPES) ×1 IMPLANT
DERMABOND ADVANCED .7 DNX12 (GAUZE/BANDAGES/DRESSINGS) ×1 IMPLANT
DRAPE SURG IRRIG POUCH 19X23 (DRAPES) ×1 IMPLANT
DURAPREP 26ML APPLICATOR (WOUND CARE) ×1 IMPLANT
GLOVE BIO SURGEON STRL SZ 6 (GLOVE) ×1 IMPLANT
GLOVE BIOGEL PI IND STRL 7.0 (GLOVE) ×2 IMPLANT
GOWN STRL REUS W/ TWL LRG LVL3 (GOWN DISPOSABLE) ×2 IMPLANT
IRRIGATION SUCT STRKRFLW 2 WTP (MISCELLANEOUS) IMPLANT
KIT PINK PAD W/HEAD ARM REST (MISCELLANEOUS) ×1 IMPLANT
KIT TURNOVER KIT B (KITS) ×1 IMPLANT
LIGASURE LAP MARYLAND 5MM 37CM (ELECTROSURGICAL) IMPLANT
NDL INSUFFLATION 14GA 120MM (NEEDLE) ×1 IMPLANT
NEEDLE INSUFFLATION 14GA 120MM (NEEDLE) ×1 IMPLANT
PACK LAPAROSCOPY BASIN (CUSTOM PROCEDURE TRAY) ×1 IMPLANT
PAD POSITIONING PINK XL (MISCELLANEOUS) ×1 IMPLANT
POUCH LAPAROSCOPIC INSTRUMENT (MISCELLANEOUS) ×1 IMPLANT
SEALER ENSEAL CRVD JAW TIP 37 (MISCELLANEOUS) IMPLANT
SET TUBE SMOKE EVAC HIGH FLOW (TUBING) ×1 IMPLANT
SHEARS HARMONIC 36 ACE (MISCELLANEOUS) IMPLANT
SLEEVE ADV FIXATION 5X100MM (TROCAR) ×1 IMPLANT
SUT VIC AB 4-0 PS2 18 (SUTURE) ×1 IMPLANT
SUT VICRYL 0 UR6 27IN ABS (SUTURE) ×1 IMPLANT
SYR 30ML LL (SYRINGE) IMPLANT
SYSTEM BAG RETRIEVAL 10MM (BASKET) IMPLANT
SYSTEM CARTER THOMASON II (TROCAR) IMPLANT
TOWEL GREEN STERILE FF (TOWEL DISPOSABLE) ×1 IMPLANT
TRAY FOLEY W/BAG SLVR 14FR (SET/KITS/TRAYS/PACK) ×1 IMPLANT
TROCAR 11X100 Z THREAD (TROCAR) ×1 IMPLANT
TROCAR ADV FIXATION 5X100MM (TROCAR) ×1 IMPLANT
WARMER LAPAROSCOPE (MISCELLANEOUS) ×1 IMPLANT

## 2023-11-28 NOTE — Addendum Note (Signed)
 Addendum  created 11/28/23 0924 by Erma Thom SAUNDERS, MD   Intraprocedure Event edited

## 2023-11-28 NOTE — Anesthesia Procedure Notes (Signed)
 Procedure Name: Intubation Date/Time: 11/28/2023 7:34 AM  Performed by: Elby Raelene SAUNDERS, CRNAPre-anesthesia Checklist: Patient identified, Emergency Drugs available, Suction available and Patient being monitored Patient Re-evaluated:Patient Re-evaluated prior to induction Oxygen Delivery Method: Circle System Utilized Preoxygenation: Pre-oxygenation with 100% oxygen Induction Type: IV induction Ventilation: Mask ventilation without difficulty Laryngoscope Size: Miller and 2 Grade View: Grade I Tube type: Oral Tube size: 7.0 mm Number of attempts: 1 Airway Equipment and Method: Stylet and Bite block Placement Confirmation: ETT inserted through vocal cords under direct vision, positive ETCO2 and breath sounds checked- equal and bilateral Secured at: 22 cm Tube secured with: Tape Dental Injury: Teeth and Oropharynx as per pre-operative assessment

## 2023-11-28 NOTE — Anesthesia Postprocedure Evaluation (Signed)
 Anesthesia Post Note  Patient: Ana Church  Procedure(s) Performed: SALPINGECTOMY, BILATERAL, LAPAROSCOPIC (Bilateral)     Patient location during evaluation: PACU Anesthesia Type: General Level of consciousness: awake and alert Pain management: pain level controlled Vital Signs Assessment: post-procedure vital signs reviewed and stable Respiratory status: spontaneous breathing, nonlabored ventilation, respiratory function stable and patient connected to nasal cannula oxygen Cardiovascular status: blood pressure returned to baseline and stable Postop Assessment: no apparent nausea or vomiting Anesthetic complications: no   No notable events documented.  Last Vitals:  Vitals:   11/28/23 0900 11/28/23 0901  BP: 111/82 111/82  Pulse: 88 87  Resp: (!) 25 13  Temp:    SpO2: 94% 94%    Last Pain:  Vitals:   11/28/23 0901  TempSrc:   PainSc: 4                  Thom JONELLE Peoples

## 2023-11-28 NOTE — Transfer of Care (Signed)
 Immediate Anesthesia Transfer of Care Note  Patient: Ana Church  Procedure(s) Performed: SALPINGECTOMY, BILATERAL, LAPAROSCOPIC (Bilateral)  Patient Location: PACU  Anesthesia Type:General  Level of Consciousness: awake, alert , and oriented  Airway & Oxygen Therapy: Patient Spontanous Breathing  Post-op Assessment: Report given to RN and Post -op Vital signs reviewed and stable  Post vital signs: Reviewed and stable  Last Vitals:  Vitals Value Taken Time  BP 116/72 11/28/23 08:30  Temp 36.9 C 11/28/23 08:26  Pulse 89 11/28/23 08:30  Resp 18 11/28/23 08:30  SpO2 96 % 11/28/23 08:30  Vitals shown include unfiled device data.  Last Pain:  Vitals:   11/28/23 9367  TempSrc: Oral  PainSc: 0-No pain      Patients Stated Pain Goal: 3 (11/28/23 9367)  Complications: No notable events documented.

## 2023-11-28 NOTE — Discharge Instructions (Signed)

## 2023-11-28 NOTE — Op Note (Signed)
 Laverda Menges PROCEDURE DATE: 11/28/2023   PREOPERATIVE DIAGNOSIS:  Undesired fertility  POSTOPERATIVE DIAGNOSIS:  Undesired fertility  PROCEDURE:  Laparoscopic Bilateral Salpingectomy   SURGEON:  Dr. Vina Solian  ASSISTANT:  Dr. Judyann Rattler. An experienced assistant was required given the standard of surgical care given the complexity of the case.  This assistant was needed for exposure, dissection, suctioning, retraction, instrument exchange, and for overall help during the procedure.  ANESTHESIA:  General endotracheal  COMPLICATIONS:  None immediate.  ESTIMATED BLOOD LOSS:  2 ml.  FLUIDS: 750 ml LR.  URINE OUTPUT:  50 ml of clear urine.  INDICATIONS: 23 y.o. G0P0000 with undesired fertility, desires permanent sterilization.  Other forms of contraception were discussed with patient and emphasized alternatives of vasectomy, IUDs and Nexplanon as they have equivalent contraceptive efficacy; she declines all other modalities.  Risks of procedure discussed with patient including permanence of method, risk of regret, bleeding, infection, injury to surrounding organs and need for additional procedures including laparotomy.  Failure risk less than 0.5% with increased risk of ectopic gestation if pregnancy occurs was also discussed with patient.  Written informed consent was obtained.    FINDINGS:  Normal uterus, fallopian tubes, and ovaries.  TECHNIQUE:  The patient was taken to the operating room where general anesthesia was obtained without difficulty.  She was then placed in the dorsal lithotomy position and prepared and draped in sterile fashion. A foley catheter was placed.  An adequate time out was performed.   A skin incision was made with the 11 blade scalpel in the umbilicus. I entered her abdominal cavity through the natural umbilical defect with a Burnard. The S retractor would not fit through the incision due to the wide the retractor so the veress needle was passed through  directly into the cavity. Opening pressure was 4.  Pneumoperitoneum achieved to a pressure of 15.  The 5 mm pedi-port was inserted into the abdomen under direct visualization. Below the point of entry and the pelvis was inspected with no evidence of injury.   The scalpel was then used to make one 5mm incsion in the LLQ and one 5 mm incision in the RLQ. Pedi-ports were inserted. The ureters were identified bilaterally. The fallopian tubes were cauterized, cut, and detached from their surrounding pelvic structures with the Ligasure. No bleeding was noted. The specimens were removed from the port. The pelvis was inspected and was hemostatic. All ports were then withdrawn and the gas drained from abdomen. They were then closed in a subcuticular fashion with 4-0 vicryl and dermabond was placed.  The patient will be discharged to home as per PACU criteria.  Routine postoperative instructions given.    Vina Solian, MD Attending Obstetrician & Gynecologist, [facility] for [facility], [facility]

## 2023-11-28 NOTE — H&P (Signed)
 [facility] Attending History and Physical  Ana Church is a 23 y.o. G0P0000 who presented to [facility]  for surgery. She desires permanent sterilization.   She is here with her partner. She is still completely sure she does not want children.    Past Medical History:  Diagnosis Date   Anxiety    Depression    Past Surgical History:  Procedure Laterality Date   NO PAST SURGERIES     OB History  Gravida Para Term Preterm AB Living  0 0 0 0 0 0  SAB IAB Ectopic Multiple Live Births  0 0 0 0 0  Patient denies any other pertinent gynecologic issues.  No current facility-administered medications on file prior to encounter.   Current Outpatient Medications on File Prior to Encounter  Medication Sig Dispense Refill   acetaminophen  (TYLENOL  8 HOUR) 650 MG CR tablet Take 650-1,300 mg by mouth every 8 (eight) hours as needed for pain.     clindamycin-benzoyl peroxide (BENZACLIN) gel Apply 1 Application topically daily at 12 noon.     escitalopram  (LEXAPRO ) 10 MG tablet Take 1 tablet (10 mg total) by mouth daily. 90 tablet 0   hydrOXYzine  (VISTARIL ) 25 MG capsule Take 1 capsule (25 mg total) by mouth every 8 (eight) hours as needed. 30 capsule 0   ibuprofen (ADVIL) 200 MG tablet Take 200 mg by mouth every 6 (six) hours as needed for mild pain (pain score 1-3) or moderate pain (pain score 4-6).     Multiple Vitamin (MULTIVITAMIN PO) Take 1 tablet by mouth daily.     ondansetron  (ZOFRAN ) 4 MG tablet Take 1 tablet (4 mg total) by mouth every 8 (eight) hours as needed for nausea or vomiting. 30 tablet 1   hydrOXYzine  (ATARAX ) 25 MG tablet Take 1 tablet (25 mg total) by mouth every 8 (eight) hours as needed for anxiety. 30 tablet 0   No Known Allergies  Social History:   reports that she has never smoked. She has never used smokeless tobacco. She reports current alcohol use of about 4.0 standard drinks of alcohol per week. She reports that she does not currently  use drugs. Family History  Problem Relation Age of Onset   Hypertension Father    Diabetes Father     Review of Systems: Pertinent items noted in HPI and remainder of comprehensive ROS otherwise negative.  PHYSICAL EXAM: Blood pressure 120/81, pulse 92, temperature 98.1 F (36.7 C), temperature source Oral, resp. rate 16, height 5' 4 (1.626 m), weight 68 kg, last menstrual period 11/26/2023, SpO2 98%. CONSTITUTIONAL: Well-developed, well-nourished female in no acute distress.  HENT:  Normocephalic, atraumatic, External right and left ear normal. Oropharynx is clear and moist EYES: Conjunctivae and EOM are normal. Pupils are equal, round, and reactive to light. No scleral icterus.  NECK: Normal range of motion, supple, no masses SKIN: Skin is warm and dry. No rash noted. Not diaphoretic. No erythema. No pallor. NEUROLOGIC: Alert and oriented to person, place, and time. Normal reflexes, muscle tone coordination. No cranial nerve deficit noted. PSYCHIATRIC: Normal mood and affect. Normal behavior. Normal judgment and thought content. CARDIOVASCULAR: Normal heart rate noted RESPIRATORY: Normal effort ABDOMEN: Soft, nontender, nondistended. PELVIC: Not examined MUSCULOSKELETAL: Normal range of motion. No tenderness.  No cyanosis, clubbing, or edema.  2+ distal pulses.  Labs: Results for orders placed or performed during the hospital encounter of 11/28/23 (from the past 2 weeks)  Pregnancy, urine POC   Collection Time: 11/28/23  5:56  AM  Result Value Ref Range   Preg Test, Ur NEGATIVE NEGATIVE  CBC   Collection Time: 11/28/23  6:42 AM  Result Value Ref Range   WBC 8.2 4.0 - 10.5 K/uL   RBC 4.08 3.87 - 5.11 MIL/uL   Hemoglobin 13.7 12.0 - 15.0 g/dL   HCT 59.8 63.9 - 53.9 %   MCV 98.3 80.0 - 100.0 fL   MCH 33.6 26.0 - 34.0 pg   MCHC 34.2 30.0 - 36.0 g/dL   RDW 86.9 88.4 - 84.4 %   Platelets 236 150 - 400 K/uL   nRBC 0.0 0.0 - 0.2 %    Imaging Studies: No results  found.  Assessment: Active Problems:   Unwanted fertility   Plan: - She desires permanent sterilization. - Discussed surgery of salpingectomy, meaning complete removal of tubes. No option for reversal.  - Risks of surgery include but are not limited to: bleeding, infection, injury to surrounding organs/tissues (i.e. bowel/bladder/ureters), need for additional procedures, wound complications, hospital re-admission, and conversion to open surgery, VTE - Reviewed restrictions and recovery following surgery - All questions answered. Reviewed postop medications.  - UPT negative.     Vina Solian, MD, FACOG Obstetrician & Gynecologist, [facility] for [facility], [facility]

## 2023-11-29 ENCOUNTER — Encounter (HOSPITAL_COMMUNITY): Payer: Self-pay | Admitting: Obstetrics and Gynecology

## 2023-11-29 ENCOUNTER — Telehealth: Payer: Self-pay | Admitting: *Deleted

## 2023-11-29 NOTE — Telephone Encounter (Signed)
Left patient a message to call and schedule. 

## 2023-11-29 NOTE — Telephone Encounter (Signed)
-----   Message from Vina Solian sent at 11/28/2023  8:12 AM EST ----- Regarding: Postop check please 4 week postop check with me please!  Thanks, pad

## 2023-11-30 ENCOUNTER — Ambulatory Visit: Payer: Self-pay | Admitting: Obstetrics and Gynecology

## 2023-11-30 LAB — SURGICAL PATHOLOGY

## 2023-12-22 ENCOUNTER — Ambulatory Visit: Admitting: Nurse Practitioner

## 2023-12-22 ENCOUNTER — Other Ambulatory Visit (HOSPITAL_COMMUNITY)
Admission: RE | Admit: 2023-12-22 | Discharge: 2023-12-22 | Disposition: A | Discharge status: Home / Self Care | Source: Ambulatory Visit | Attending: Nurse Practitioner | Admitting: Nurse Practitioner

## 2023-12-22 VITALS — BP 118/88 | HR 88 | Temp 97.5°F | Ht 64.0 in | Wt 150.6 lb

## 2023-12-22 DIAGNOSIS — R7989 Other specified abnormal findings of blood chemistry: Secondary | ICD-10-CM

## 2023-12-22 DIAGNOSIS — F419 Anxiety disorder, unspecified: Secondary | ICD-10-CM | POA: Diagnosis not present

## 2023-12-22 DIAGNOSIS — Z124 Encounter for screening for malignant neoplasm of cervix: Secondary | ICD-10-CM | POA: Diagnosis present

## 2023-12-22 DIAGNOSIS — Z114 Encounter for screening for human immunodeficiency virus [HIV]: Secondary | ICD-10-CM

## 2023-12-22 DIAGNOSIS — Z23 Encounter for immunization: Secondary | ICD-10-CM

## 2023-12-22 DIAGNOSIS — Z1159 Encounter for screening for other viral diseases: Secondary | ICD-10-CM

## 2023-12-22 DIAGNOSIS — E559 Vitamin D deficiency, unspecified: Secondary | ICD-10-CM | POA: Diagnosis not present

## 2023-12-22 DIAGNOSIS — Z111 Encounter for screening for respiratory tuberculosis: Secondary | ICD-10-CM

## 2023-12-22 DIAGNOSIS — Z0000 Encounter for general adult medical examination without abnormal findings: Secondary | ICD-10-CM | POA: Insufficient documentation

## 2023-12-22 DIAGNOSIS — Z0184 Encounter for antibody response examination: Secondary | ICD-10-CM

## 2023-12-22 MED ORDER — ONDANSETRON 4 MG PO TBDP: 4.0000 mg | ORAL_TABLET | Freq: Four times a day (QID) | ORAL | 1 refills | Status: AC | PRN

## 2023-12-22 MED ORDER — ESCITALOPRAM OXALATE 10 MG PO TABS: 10.0000 mg | ORAL_TABLET | Freq: Every day | ORAL | 3 refills | Status: AC

## 2023-12-22 NOTE — Assessment & Plan Note (Signed)
Check vitamin D and treat based on results.  ?

## 2023-12-22 NOTE — Patient Instructions (Signed)
 It was great to see you!  We are checking your labs today and will let you know the results via mychart/phone.   Good luck with nursing school!   Let's follow-up in 1 year, sooner if you have concerns.  If a referral was placed today, you will be contacted for an appointment. Please note that routine referrals can sometimes take up to 3-4 weeks to process. Please call our office if you haven't heard anything after this time frame.  Take care,  Tinnie Harada, NP

## 2023-12-22 NOTE — Assessment & Plan Note (Signed)
Check TSH, free T4 today

## 2023-12-22 NOTE — Assessment & Plan Note (Signed)
 Health maintenance reviewed and updated. Discussed nutrition, exercise. Check CMP, CBC today. Follow-up 1 year.

## 2023-12-22 NOTE — Assessment & Plan Note (Signed)
 Chronic, stable. Continue lexapro  10mg  daily and hydroxyzine  25mg  TID prn anxiety. Follow-up in-person in 1 year

## 2023-12-22 NOTE — Progress Notes (Signed)
 BP 118/88 (BP Location: Left Arm, Patient Position: Sitting, Cuff Size: Normal)   Pulse 88   Temp (!) 97.5 F (36.4 C)   Ht 5' 4 (1.626 m)   Wt 150 lb 9.6 oz (68.3 kg)   LMP 11/28/2023 (Approximate)   SpO2 98%   BMI 25.85 kg/m    Subjective:    Patient ID: Ana Church, female    DOB: February 28, 2000, 23 y.o.   MRN: [id number]  CC: Chief Complaint  Patient presents with   Annual Exam    With fasting labs and form completion, Flu Vaccine    HPI: Ana Church is a 23 y.o. female presenting on 12/22/2023 for comprehensive medical examination. Current medical complaints include:none  Menopausal Symptoms: no  Depression and Anxiety Screen done today and results listed below:     12/22/2023    3:12 PM 10/11/2023    2:29 PM 07/11/2023    8:52 AM 02/08/2023    4:42 PM 12/30/2022    3:41 PM  Depression screen PHQ 2/9  Decreased Interest 1 0 2 1 2   Down, Depressed, Hopeless 1 0 2 1 1   PHQ - 2 Score 2 0 4 2 3   Altered sleeping 1 1 1  0 1  Tired, decreased energy 1 1 1  0 2  Change in appetite 2 1 2  0 0  Feeling bad or failure about yourself  0 1 3 0 1  Trouble concentrating 2 2 2  0 1  Moving slowly or fidgety/restless 0 0 0 0 0  Suicidal thoughts 0 0 0 0 1  PHQ-9 Score 8 6  13  2  9    Difficult doing work/chores Somewhat difficult Somewhat difficult Somewhat difficult  Somewhat difficult     Data saved with a previous flowsheet row definition      12/22/2023    3:12 PM 10/11/2023    2:31 PM 02/08/2023    4:42 PM 12/30/2022    3:43 PM  GAD 7 : Generalized Anxiety Score  Nervous, Anxious, on Edge 2 2 1 1   Control/stop worrying 1 1 1 1   Worry too much - different things 2 1 1 2   Trouble relaxing 0 0 1 1  Restless 0 1 0 0  Easily annoyed or irritable 1 0 0 0  Afraid - awful might happen 0 1 1 0  Total GAD 7 Score 6 6 5 5   Anxiety Difficulty Somewhat difficult Not difficult at all  Somewhat difficult    The patient does not have a history of falls. I did not complete a  risk assessment for falls. A plan of care for falls was not documented.   Past Medical History:  Past Medical History:  Diagnosis Date   Anxiety    Depression     Surgical History:  Past Surgical History:  Procedure Laterality Date   LAPAROSCOPIC BILATERAL SALPINGECTOMY Bilateral 11/28/2023   Procedure: SALPINGECTOMY, BILATERAL, LAPAROSCOPIC;  Surgeon: Cleatus Moccasin, MD;  Location: [facility] OR;  Service: Gynecology;  Laterality: Bilateral;   NO PAST SURGERIES      Medications:  Current Outpatient Medications on File Prior to Visit  Medication Sig   clindamycin-benzoyl peroxide (BENZACLIN) gel Apply 1 Application topically daily at 12 noon.   hydrOXYzine  (VISTARIL ) 25 MG capsule Take 1 capsule (25 mg total) by mouth every 8 (eight) hours as needed.   Multiple Vitamin (MULTIVITAMIN PO) Take 1 tablet by mouth daily.   acetaminophen  (TYLENOL  8 HOUR) 650 MG CR tablet Take 650-1,300 mg by mouth every  8 (eight) hours as needed for pain. (Patient not taking: Reported on 12/22/2023)   hydrOXYzine  (ATARAX ) 25 MG tablet Take 1 tablet (25 mg total) by mouth every 8 (eight) hours as needed for anxiety. (Patient not taking: Reported on 12/22/2023)   ibuprofen  (ADVIL ) 800 MG tablet Take 1 tablet (800 mg total) by mouth 3 (three) times daily with meals as needed for headache, moderate pain (pain score 4-6) or cramping. (Patient not taking: Reported on 12/22/2023)   No current facility-administered medications on file prior to visit.    Allergies:  Allergies[1]  Social History:  Social History   Socioeconomic History   Marital status: Single    Spouse name: Not on file   Number of children: Not on file   Years of education: Not on file   Highest education level: Bachelor's degree (e.g., BA, AB, BS)  Occupational History   Not on file  Tobacco Use   Smoking status: Never   Smokeless tobacco: Never  Vaping Use   Vaping status: Never Used  Substance and Sexual Activity   Alcohol use: Yes     Alcohol/week: 4.0 standard drinks of alcohol    Types: 4 Shots of liquor per week    Comment: occassionally   Drug use: Not Currently   Sexual activity: Yes    Birth control/protection: Condom, None  Other Topics Concern   Not on file  Social History Narrative   Not on file   Social Drivers of Health   Tobacco Use: Low Risk (12/22/2023)   Patient History    Smoking Tobacco Use: Never    Smokeless Tobacco Use: Never    Passive Exposure: Not on file  Financial Resource Strain: Medium Risk (12/22/2023)   Overall Financial Resource Strain (CARDIA)    Difficulty of Paying Living Expenses: Somewhat hard  Food Insecurity: Food Insecurity Present (12/22/2023)   Epic    Worried About Programme Researcher, Broadcasting/film/video in the Last Year: Sometimes true    Ran Out of Food in the Last Year: Never true  Transportation Needs: Unmet Transportation Needs (12/22/2023)   Epic    Lack of Transportation (Medical): Patient declined    Lack of Transportation (Non-Medical): Yes  Physical Activity: Insufficiently Active (12/22/2023)   Exercise Vital Sign    Days of Exercise per Week: 2 days    Minutes of Exercise per Session: 50 min  Stress: Patient Declined (12/22/2023)   [employer] of Occupational Health - Occupational Stress Questionnaire    Feeling of Stress: Patient declined  Social Connections: Unknown (12/22/2023)   Social Connection and Isolation Panel    Frequency of Communication with Friends and Family: Patient declined    Frequency of Social Gatherings with Friends and Family: Patient declined    Attends Religious Services: More than 4 times per year    Active Member of Clubs or Organizations: Yes    Attends Banker Meetings: More than 4 times per year    Marital Status: Living with partner  Intimate Partner Violence: Not on file  Depression (PHQ2-9): Medium Risk (12/22/2023)   Depression (PHQ2-9)    PHQ-2 Score: 8  Alcohol Screen: High Risk (12/22/2023)   Alcohol Screen     Last Alcohol Screening Score (AUDIT): 20  Housing: Unknown (12/22/2023)   Epic    Unable to Pay for Housing in the Last Year: Patient declined    Number of Times Moved in the Last Year: 0    Homeless in the Last Year: No  Utilities: Low  Risk (10/13/2022)   Received from [facility]   Utilities    In the past 12 months has the electric, gas, oil, or water company threatened to shut off services in your home? : No  Health Literacy: Not on file   Tobacco Use History[2] Social History   Substance and Sexual Activity  Alcohol Use Yes   Alcohol/week: 4.0 standard drinks of alcohol   Types: 4 Shots of liquor per week   Comment: occassionally    Family History:  Family History  Problem Relation Age of Onset   Hypertension Father    Diabetes Father     Past medical history, surgical history, medications, allergies, family history and social history reviewed with patient today and changes made to appropriate areas of the chart.   Review of Systems  Constitutional: Negative.   HENT: Negative.    Eyes: Negative.   Respiratory: Negative.    Cardiovascular: Negative.   Gastrointestinal:  Positive for nausea. Negative for abdominal pain, constipation, diarrhea and vomiting.  Genitourinary: Negative.   Musculoskeletal: Negative.   Skin: Negative.   Neurological: Negative.   Psychiatric/Behavioral: Negative.     All other ROS negative except what is listed above and in the HPI.      Objective:    BP 118/88 (BP Location: Left Arm, Patient Position: Sitting, Cuff Size: Normal)   Pulse 88   Temp (!) 97.5 F (36.4 C)   Ht 5' 4 (1.626 m)   Wt 150 lb 9.6 oz (68.3 kg)   LMP 11/28/2023 (Approximate)   SpO2 98%   BMI 25.85 kg/m   Wt Readings from Last 3 Encounters:  12/22/23 150 lb 9.6 oz (68.3 kg)  11/28/23 150 lb (68 kg)  10/11/23 150 lb (68 kg)    Physical Exam Vitals and nursing note reviewed. Exam conducted with a chaperone present.  Constitutional:      General: She  is not in acute distress.    Appearance: Normal appearance.  HENT:     Head: Normocephalic and atraumatic.     Right Ear: Tympanic membrane, ear canal and external ear normal.     Left Ear: Tympanic membrane, ear canal and external ear normal.     Mouth/Throat:     Mouth: Mucous membranes are moist.     Pharynx: No posterior oropharyngeal erythema.  Eyes:     Conjunctiva/sclera: Conjunctivae normal.  Cardiovascular:     Rate and Rhythm: Normal rate and regular rhythm.     Pulses: Normal pulses.     Heart sounds: Normal heart sounds.  Pulmonary:     Effort: Pulmonary effort is normal.     Breath sounds: Normal breath sounds.  Abdominal:     Palpations: Abdomen is soft.     Tenderness: There is no abdominal tenderness.  Genitourinary:    General: Normal vulva.     Exam position: Lithotomy position.     Labia:        Right: No rash, tenderness or lesion.        Left: No rash, tenderness or lesion.      Vagina: Normal.     Cervix: Normal.     Uterus: Normal.      Adnexa: Right adnexa normal and left adnexa normal.  Musculoskeletal:        General: Normal range of motion.     Cervical back: Normal range of motion and neck supple.     Right lower leg: No edema.     Left lower leg:  No edema.  Lymphadenopathy:     Cervical: No cervical adenopathy.  Skin:    General: Skin is warm and dry.  Neurological:     General: No focal deficit present.     Mental Status: She is alert and oriented to person, place, and time.     Cranial Nerves: No cranial nerve deficit.     Coordination: Coordination normal.     Gait: Gait normal.  Psychiatric:        Mood and Affect: Mood normal.        Behavior: Behavior normal.        Thought Content: Thought content normal.        Judgment: Judgment normal.     Results for orders placed or performed during the hospital encounter of 11/28/23  Pregnancy, urine POC   Collection Time: 11/28/23  5:56 AM  Result Value Ref Range   Preg Test, Ur  NEGATIVE NEGATIVE  CBC   Collection Time: 11/28/23  6:42 AM  Result Value Ref Range   WBC 8.2 4.0 - 10.5 K/uL   RBC 4.08 3.87 - 5.11 MIL/uL   Hemoglobin 13.7 12.0 - 15.0 g/dL   HCT 59.8 63.9 - 53.9 %   MCV 98.3 80.0 - 100.0 fL   MCH 33.6 26.0 - 34.0 pg   MCHC 34.2 30.0 - 36.0 g/dL   RDW 86.9 88.4 - 84.4 %   Platelets 236 150 - 400 K/uL   nRBC 0.0 0.0 - 0.2 %  Type and screen   Collection Time: 11/28/23  6:42 AM  Result Value Ref Range   ABO/RH(D) O POS    Antibody Screen NEG    Sample Expiration      12/01/2023,2359 Performed at [facility], [address] [place], [place] [address]   ABO/Rh   Collection Time: 11/28/23  6:49 AM  Result Value Ref Range   ABO/RH(D)      O POS Performed at [facility], [address], [place], [place] [address]   Surgical pathology   Collection Time: 11/28/23  8:05 AM  Result Value Ref Range   SURGICAL PATHOLOGY      SURGICAL PATHOLOGY CASE: [id number] PATIENT: Elga Bellamy Surgical Pathology Report     Clinical History: Undesired fertility (crm)     FINAL MICROSCOPIC DIAGNOSIS:  A. FALLOPIAN TUBES, BILATERAL, SALPINGECTOMY: - Segments of bilateral fallopian tubes with complete cross section     GROSS DESCRIPTION:  A. Received fresh and subsequently placed in formalin labeled with the patient's name and Bilateral tubes are two undesignated red-tan, fimbriated fallopian tubes, 10.6 x 0.4 cm and 11.8 x 0.4 cm, with unremarkable cut and serosal surfaces. The fimbria and representative cross sections are each submitted in their own cassettes (2 blocks total).  (LEF 11/28/2023)  Final Diagnosis performed by Katrine Muskrat, MD.   Electronically signed 11/30/2023 Technical component performed at [facility]. [facility], [address], [place], [place] [address].  Professional component performed at [facility], [address] [place], [place] [address].   Immunohistochemistry Technical component (if applicable) was performed at [facility]. [address], [place], [place] [address].   IMMUNOHISTOCHEMISTRY DISCLAIMER (if applicable): Some of these immunohistochemical stains may have been developed and the performance characteristics determine by [facility]. Some may not have been cleared or approved by the U.S. Food and Drug Administration. The FDA has determined that such clearance or approval is not necessary. This test  is used for clinical purposes. It should not be regarded as investigational or for research. This laboratory is certified under the Clinical Laboratory Improvement Amendments of 1988 (CLIA-88) as qualified to perform high complexity clinical laboratory testing.  The controls stained appropriately.   IHC stains are performed on formalin fixed, paraffin embedded tissue using a 3,3diaminobenzidine (DAB) chromogen and Leica Bond Autostainer Syst em. The staining intensity of the nucleus is score manually and is reported as the percentage of tumor cell nuclei demonstrating specific nuclear staining. The specimens are fixed in 10% Neutral Formalin for at least 6 hours and up to 72hrs. These tests are validated on decalcified tissue. Results should be interpreted with caution given the possibility of false negative results on decalcified specimens. Antibody Clones are as follows ER-clone [id number], PR-clone 16, Ki67- clone [id number]. Some of these immunohistochemical stains may have been developed and the performance characteristics determined by [facility].       Assessment & Plan:   Problem List Items Addressed This Visit       Other   Vitamin D  insufficiency   Check vitamin D  and treat based on results.       Relevant Orders   VITAMIN D  25 Hydroxy (Vit-D Deficiency, Fractures)   Elevated TSH   Check TSH, free T4 today.       Relevant Orders   T4, free   TSH   Anxiety  and depression   Chronic, stable. Continue lexapro  10mg  daily and hydroxyzine  25mg  TID prn anxiety. Follow-up in-person in 1 year      Relevant Medications   escitalopram  (LEXAPRO ) 10 MG tablet   Routine general medical examination at a health care facility - Primary   Health maintenance reviewed and updated. Discussed nutrition, exercise. Check CMP, CBC today. Follow-up 1 year.        Relevant Orders   CBC with Differential/Platelet   Comprehensive metabolic panel with GFR   Other Visit Diagnoses       Immunity status testing       Check hepatitis B, MMR, varicella titers per school requirements   Relevant Orders   Varicella zoster antibody, IgG   Measles/Mumps/Rubella Immunity   Hepatitis B surface antibody,quantitative     Screening for cervical cancer       Pap done today   Relevant Orders   Cytology - PAP     Screening for HIV (human immunodeficiency virus)       Screen HIV today   Relevant Orders   HIV Antibody (routine testing w rflx)     Encounter for hepatitis C screening test for low risk patient       Screen hepatitis C today   Relevant Orders   Hepatitis C antibody     Screening-pulmonary TB       Screen TB for school clinical requirements   Relevant Orders   QuantiFERON-TB Gold Plus     Immunization due       Tdap, HPV #2 and flu vaccines given today   Relevant Orders   Tdap vaccine greater than or equal to 7yo IM (Completed)   HPV 9-valent vaccine,Recombinat (Completed)   Flu vaccine trivalent PF, 6mos and older(Flulaval,Afluria,Fluarix,Fluzone) (Completed)        Follow up plan: Return in about 1 year (around 12/21/2024) for CPE.   LABORATORY TESTING:  - Pap smear: pap done  IMMUNIZATIONS:   - Tdap: Tetanus vaccination status reviewed: last tetanus booster within 10 years. - Influenza: Administered today - Pneumovax: Not applicable -  Prevnar: Not applicable - HPV: Administered today - Shingrix vaccine: Not  applicable  SCREENING: -Mammogram: Not applicable  - Colonoscopy: Not applicable  - Bone Density: Not applicable   PATIENT COUNSELING:   Advised to take 1 mg of folate supplement per day if capable of pregnancy.   Sexuality: Discussed sexually transmitted diseases, partner selection, use of condoms, avoidance of unintended pregnancy  and contraceptive alternatives.   Advised to avoid cigarette smoking.  I discussed with the patient that most people either abstain from alcohol or drink within safe limits (<=14/week and <=4 drinks/occasion for males, <=7/weeks and <= 3 drinks/occasion for females) and that the risk for alcohol disorders and other health effects rises proportionally with the number of drinks per week and how often a drinker exceeds daily limits.  Discussed cessation/primary prevention of drug use and availability of treatment for abuse.   Diet: Encouraged to adjust caloric intake to maintain  or achieve ideal body weight, to reduce intake of dietary saturated fat and total fat, to limit sodium intake by avoiding high sodium foods and not adding table salt, and to maintain adequate dietary potassium and calcium preferably from fresh fruits, vegetables, and low-fat dairy products.    stressed the importance of regular exercise  Injury prevention: Discussed safety belts, safety helmets, smoke detector, smoking near bedding or upholstery.   Dental health: Discussed importance of regular tooth brushing, flossing, and dental visits.    NEXT PREVENTATIVE PHYSICAL DUE IN 1 YEAR. Return in about 1 year (around 12/21/2024) for CPE.  Diamon Reddinger A Armaan Pond     [1] No Known Allergies [2]  Social History Tobacco Use  Smoking Status Never  Smokeless Tobacco Never

## 2023-12-23 LAB — CBC WITH DIFFERENTIAL/PLATELET
Absolute Lymphocytes: 2604 {cells}/uL (ref 850–3900)
Absolute Monocytes: 782 {cells}/uL (ref 200–950)
Basophils Absolute: 59 {cells}/uL (ref 0–200)
Basophils Relative: 0.6 %
Eosinophils Absolute: 40 {cells}/uL (ref 15–500)
Eosinophils Relative: 0.4 %
HCT: 41.4 % (ref 35.9–46.0)
Hemoglobin: 13.8 g/dL (ref 11.7–15.5)
MCH: 33.2 pg — ABNORMAL HIGH (ref 27.0–33.0)
MCHC: 33.3 g/dL (ref 31.6–35.4)
MCV: 99.5 fL (ref 81.4–101.7)
MPV: 9.9 fL (ref 7.5–12.5)
Monocytes Relative: 7.9 %
Neutro Abs: 6415 {cells}/uL (ref 1500–7800)
Neutrophils Relative %: 64.8 %
Platelets: 269 Thousand/uL (ref 140–400)
RBC: 4.16 Million/uL (ref 3.80–5.10)
RDW: 13.1 % (ref 11.0–15.0)
Total Lymphocyte: 26.3 %
WBC: 9.9 Thousand/uL (ref 3.8–10.8)

## 2023-12-23 LAB — TSH: TSH: 1.52 m[IU]/L

## 2023-12-23 LAB — COMPREHENSIVE METABOLIC PANEL WITH GFR
AG Ratio: 1.8 (calc) (ref 1.0–2.5)
ALT: 20 U/L (ref 6–29)
AST: 34 U/L — ABNORMAL HIGH (ref 10–30)
Albumin: 4.8 g/dL (ref 3.6–5.1)
Alkaline phosphatase (APISO): 45 U/L (ref 31–125)
BUN: 12 mg/dL (ref 7–25)
CO2: 22 mmol/L (ref 20–32)
Calcium: 9.7 mg/dL (ref 8.6–10.2)
Chloride: 104 mmol/L (ref 98–110)
Creat: 0.68 mg/dL (ref 0.50–0.96)
Globulin: 2.7 g/dL (ref 1.9–3.7)
Glucose, Bld: 77 mg/dL (ref 65–99)
Potassium: 3.9 mmol/L (ref 3.5–5.3)
Sodium: 137 mmol/L (ref 135–146)
Total Bilirubin: 1 mg/dL (ref 0.2–1.2)
Total Protein: 7.5 g/dL (ref 6.1–8.1)
eGFR: 125 mL/min/1.73m2 (ref >=60)

## 2023-12-23 LAB — VITAMIN D 25 HYDROXY (VIT D DEFICIENCY, FRACTURES): Vit D, 25-Hydroxy: 18 ng/mL — ABNORMAL LOW (ref 30–100)

## 2023-12-23 LAB — T4, FREE: Free T4: 1.2 ng/dL (ref 0.8–1.8)

## 2023-12-25 ENCOUNTER — Ambulatory Visit: Payer: Self-pay | Admitting: Nurse Practitioner

## 2023-12-25 DIAGNOSIS — R7989 Other specified abnormal findings of blood chemistry: Secondary | ICD-10-CM

## 2023-12-25 MED ORDER — VITAMIN D (ERGOCALCIFEROL) 1.25 MG (50000 UNIT) PO CAPS: 50000.0000 [IU] | ORAL_CAPSULE | ORAL | 0 refills | Status: AC

## 2023-12-26 LAB — MEASLES/MUMPS/RUBELLA IMMUNITY
Mumps IgG: 12.6 [AU]/ml
Rubella: 3.32 {index}
Rubeola IgG: 59.3 [AU]/ml

## 2023-12-26 LAB — HEPATITIS C ANTIBODY: Hepatitis C Ab: NONREACTIVE

## 2023-12-26 LAB — QUANTIFERON-TB GOLD PLUS
Mitogen-NIL: >10 [IU]/mL
NIL: 0.02 [IU]/mL
QuantiFERON-TB Gold Plus: NEGATIVE
TB1-NIL: 0.01 [IU]/mL
TB2-NIL: 0 [IU]/mL

## 2023-12-26 LAB — VARICELLA ZOSTER ANTIBODY, IGG: Varicella IgG: 2.35 {s_co_ratio}

## 2023-12-26 LAB — HIV ANTIBODY (ROUTINE TESTING W REFLEX)
HIV 1&2 Ab, 4th Generation: NONREACTIVE
HIV FINAL INTERPRETATION: NEGATIVE

## 2023-12-26 LAB — HEPATITIS B SURFACE ANTIBODY, QUANTITATIVE: Hep B S AB Quant (Post): 68 m[IU]/mL (ref >=10)

## 2023-12-27 LAB — CYTOLOGY - PAP
Adequacy: ABSENT
Chlamydia: NEGATIVE
Comment: NEGATIVE
Comment: NORMAL
Diagnosis: NEGATIVE
Neisseria Gonorrhea: NEGATIVE

## 2023-12-29 ENCOUNTER — Encounter: Payer: Self-pay | Admitting: Medical

## 2024-01-03 ENCOUNTER — Telehealth

## 2024-01-03 DIAGNOSIS — R6889 Other general symptoms and signs: Secondary | ICD-10-CM | POA: Diagnosis not present

## 2024-01-03 DIAGNOSIS — Z20828 Contact with and (suspected) exposure to other viral communicable diseases: Secondary | ICD-10-CM

## 2024-01-04 MED ORDER — OSELTAMIVIR PHOSPHATE 75 MG PO CAPS: 75.0000 mg | ORAL_CAPSULE | Freq: Two times a day (BID) | ORAL | 0 refills | Status: DC

## 2024-01-04 MED ORDER — BENZONATATE 100 MG PO CAPS: 100.0000 mg | ORAL_CAPSULE | Freq: Three times a day (TID) | ORAL | 0 refills | Status: DC | PRN

## 2024-01-04 MED ORDER — NAPROXEN 500 MG PO TABS: 500.0000 mg | ORAL_TABLET | Freq: Two times a day (BID) | ORAL | 0 refills | Status: AC

## 2024-01-04 NOTE — Progress Notes (Signed)
 E visit for Flu like symptoms   We are sorry that you are not feeling well.  Here is how we plan to help! Based on what you have shared with me it looks like you may have a respiratory virus that may be influenza.  Influenza or the flu is  an infection caused by a respiratory virus. The flu virus is highly contagious and persons who did not receive their yearly flu vaccination may catch the flu from close contact.  We have anti-viral medications to treat the viruses that cause this infection. They are not a cure and only shorten the course of the infection. These prescriptions are most effective when they are given within the first 2 days of flu symptoms. Antiviral medications are indicated if you have a high risk of complications from the flu. You should  also consider an antiviral medication if you are in close contact with someone who is at risk. These medications can help patients avoid complications from the flu but have side effects that you should know.   Possible side effects from Tamiflu  or oseltamivir  include nausea, vomiting, diarrhea, dizziness, headaches, eye redness, sleep problems or other respiratory symptoms. You should not take Tamiflu  if you have an allergy to oseltamivir  or any to the ingredients in Tamiflu .  Based upon your symptoms and potential risk factors I have prescribed Oseltamivir  (Tamiflu ).  It has been sent to your designated pharmacy.  You will take one 75 mg capsule orally twice a day for the next 5 days.   For nasal congestion, you may use an oral decongestant such as Mucinex D or if you have glaucoma or high blood pressure use plain Mucinex.  Saline nasal spray or nasal drops can help and can safely be used as often as needed for congestion.  If you have a sore or scratchy throat, use a saltwater gargle-  to  teaspoon of salt dissolved in a 4-ounce to 8-ounce glass of warm water.  Gargle the solution for approximately 15-30 seconds and then spit.  It is  important not to swallow the solution.  You can also use throat lozenges/cough drops and Chloraseptic spray to help with throat pain or discomfort.  Warm or cold liquids can also be helpful in relieving throat pain.  For headache, pain or general discomfort, you can use Ibuprofen  or Tylenol  as directed.   Some authorities believe that zinc sprays or the use of Echinacea may shorten the course of your symptoms.  I have prescribed the following medications to help lessen symptoms: I have prescribed Tessalon  Perles 100 mg. You may take 1-2 capsules every 8 hours as needed for cough and I have prescribed an anti-inflammatory - Naprosyn 500 mg. Take twice daily as needed for fever or body aches for 2 weeks  You are to isolate at home until you have been fever-free for at least 24 hours without a fever-reducing medication, and symptoms have been steadily improving for 24 hours.  If you must be around other household members who do not have symptoms, you need to make sure that both you and the family members are masking consistently with a high-quality mask.  If you note any worsening of symptoms despite treatment, please seek an in-person evaluation ASAP. If you note any significant shortness of breath or any chest pain, please seek ED evaluation. Please do not delay care!  ANYONE WHO HAS FLU SYMPTOMS SHOULD: Stay home. The flu is highly contagious and going out or to work exposes others! Be  sure to drink plenty of fluids. Water is fine as well as fruit juices, sodas and electrolyte beverages. You may want to stay away from caffeine or alcohol. If you are nauseated, try taking small sips of liquids. How do you know if you are getting enough fluid? Your urine should be a pale yellow or almost colorless. Get rest. Taking a steamy shower or using a humidifier may help nasal congestion and ease sore throat pain. Using a saline nasal spray works much the same way. Cough drops, hard candies and sore throat  lozenges may ease your cough. Line up a caregiver. Have someone check on you regularly.  GET HELP RIGHT AWAY IF: You cannot keep down liquids or your medications. You become short of breath Your fell like you are going to pass out or loose consciousness. Your symptoms persist after you have completed your treatment plan  MAKE SURE YOU  Understand these instructions. Will watch your condition. Will get help right away if you are not doing well or get worse.  Your e-visit answers were reviewed by a board certified advanced clinical practitioner to complete your personal care plan.  Depending on the condition, your plan could have included both over the counter or prescription medications.  If there is a problem please reply  once you have received a response from your provider.  Your safety is important to us .  If you have drug allergies check your prescription carefully.    You can use MyChart to ask questions about todays visit, request a non-urgent call back, or ask for a work or school excuse for 24 hours related to this e-Visit. If it has been greater than 24 hours you will need to follow up with your provider, or enter a new e-Visit to address those concerns.  You will get an e-mail in the next two days asking about your experience.  I hope that your e-visit has been valuable and will speed your recovery. Thank you for using e-visits.   I have spent 5 minutes in review of e-visit questionnaire, review and updating patient chart, medical decision making and response to patient.   Delon CHRISTELLA Dickinson, PA-C

## 2024-01-08 ENCOUNTER — Encounter: Payer: Self-pay | Admitting: Obstetrics and Gynecology

## 2024-01-08 ENCOUNTER — Ambulatory Visit (INDEPENDENT_AMBULATORY_CARE_PROVIDER_SITE_OTHER): Admitting: Obstetrics and Gynecology

## 2024-01-08 VITALS — BP 124/87 | HR 102 | Ht 64.0 in | Wt 141.0 lb

## 2024-01-08 DIAGNOSIS — Z3009 Encounter for other general counseling and advice on contraception: Secondary | ICD-10-CM | POA: Diagnosis not present

## 2024-01-08 DIAGNOSIS — Z9079 Acquired absence of other genital organ(s): Secondary | ICD-10-CM | POA: Diagnosis not present

## 2024-01-08 NOTE — Progress Notes (Signed)
" ° ° °  PostOp Visit Note  Ana Church is a 23 y.o. G0P0000 female who presents for a postoperative visit. She is 5weeks postop following a laparoscopic bilateral salpingectomy on 11/28/23.  Today she notes no complaints, she is recovering well. She is happy with her decision  Tolerating gen diet.  +Flatus, Regular BMs.  Denies pain.  Overall doing well and reports no acute complaints   Review of Systems Pertinent items are noted in HPI.    Objective:  BP 124/87   Pulse (!) 102   Ht 5' 4 (1.626 m)   Wt 141 lb (64 kg)   LMP 12/28/2023 (Approximate)   BMI 24.20 kg/m    Physical Examination:  GENERAL ASSESSMENT: well developed and well nourished SKIN: normal color, no lesions CHEST: normal air exchange, respiratory effort normal with no retractions ABDOMEN: non distended  INCISION: well approximated incisions abdomen  EXTREMITY: normal and symmetric movement, normal range of motion PSYCH: mood appropriate, normal affect        Assessment/Plan:  1. Unwanted fertility (Primary) 2. H/O bilateral salpingectomy Doing well overall, happy with decision.  Incision healing well, approximated, follow up PRN   Nidia Daring, FNP  [facility] [employer]    "

## 2024-01-13 ENCOUNTER — Other Ambulatory Visit: Payer: Self-pay

## 2024-01-13 ENCOUNTER — Emergency Department (HOSPITAL_COMMUNITY)
Admission: EM | Admit: 2024-01-13 | Discharge: 2024-01-14 | Disposition: A | Attending: Emergency Medicine | Admitting: Emergency Medicine

## 2024-01-13 ENCOUNTER — Encounter (HOSPITAL_COMMUNITY): Payer: Self-pay

## 2024-01-13 DIAGNOSIS — I471 Supraventricular tachycardia, unspecified: Secondary | ICD-10-CM | POA: Diagnosis not present

## 2024-01-13 DIAGNOSIS — R002 Palpitations: Secondary | ICD-10-CM | POA: Diagnosis present

## 2024-01-13 MED ORDER — SODIUM CHLORIDE 0.9 % IV BOLUS
1000.0000 mL | Freq: Once | INTRAVENOUS | Status: AC
  Administered 2024-01-14: 1000 mL via INTRAVENOUS

## 2024-01-13 NOTE — ED Provider Triage Note (Signed)
 Emergency Medicine Provider Triage Evaluation Note  Ana Church , a 24 y.o. female  was evaluated in triage.  Pt complains of palpitations.  Started at 1020. HR 250 and BP 70 for EMS.  Treated with adenosine 6 and 12 with conversion to sinus tach.  Feels better.  Review of Systems  Positive: Chest pain Negative: fever  Physical Exam  BP 112/83 (BP Location: Right Arm)   Pulse (!) 123   Temp 98.2 F (36.8 C) (Oral)   Resp 18   Ht 5' 4 (1.626 m)   Wt 64 kg   LMP 12/28/2023 (Approximate)   SpO2 98%   BMI 24.20 kg/m  Gen:   Awake, no distress   Resp:  Normal effort  MSK:   Moves extremities without difficulty  Other:    Medical Decision Making  Medically screening exam initiated at 11:54 PM.  Appropriate orders placed.  Romanita Grist was informed that the remainder of the evaluation will be completed by another provider, this initial triage assessment does not replace that evaluation, and the importance of remaining in the ED until their evaluation is complete.     Griselda Norris, MD 01/14/24 [phone]

## 2024-01-13 NOTE — ED Triage Notes (Signed)
 Pt complaining of heart racing since 1020 tonight. Started at 250's and converted after 18mg  of adenosine. Hr is currently 123

## 2024-01-13 NOTE — ED Provider Notes (Signed)
 " [place] EMERGENCY DEPARTMENT AT [facility] Provider Note   CSN: [id number] Arrival date & time: 01/13/24  2348     Patient presents with: Palpitations   Ana Church is a 24 y.o. female.  {Add pertinent medical, surgical, social history, OB history to HPI:32947}  Palpitations      Prior to Admission medications  Medication Sig Start Date End Date Taking? Authorizing Provider  acetaminophen  (TYLENOL  8 HOUR) 650 MG CR tablet Take 650-1,300 mg by mouth every 8 (eight) hours as needed for pain. Patient not taking: Reported on 01/08/2024    [provider]  clindamycin-benzoyl peroxide (BENZACLIN) gel Apply 1 Application topically daily at 12 noon.    [provider]  escitalopram  (LEXAPRO ) 10 MG tablet Take 1 tablet (10 mg total) by mouth daily. 12/22/23 03/21/24  McElwee, Tinnie LABOR, NP  hydrOXYzine  (ATARAX ) 25 MG tablet Take 1 tablet (25 mg total) by mouth every 8 (eight) hours as needed for anxiety. Patient not taking: Reported on 01/08/2024 11/11/22   Nedra Tinnie LABOR, NP  hydrOXYzine  (VISTARIL ) 25 MG capsule Take 1 capsule (25 mg total) by mouth every 8 (eight) hours as needed. 09/13/23   Blair, Diane W, FNP  ibuprofen  (ADVIL ) 800 MG tablet Take 1 tablet (800 mg total) by mouth 3 (three) times daily with meals as needed for headache, moderate pain (pain score 4-6) or cramping. Patient not taking: Reported on 01/08/2024 11/28/23   Cleatus Moccasin, MD  Multiple Vitamin (MULTIVITAMIN PO) Take 1 tablet by mouth daily.    [provider]  naproxen  (NAPROSYN ) 500 MG tablet Take 1 tablet (500 mg total) by mouth 2 (two) times daily with a meal. 01/04/24   Burnette, Delon HERO, PA-C  ondansetron  (ZOFRAN -ODT) 4 MG disintegrating tablet Take 1 tablet (4 mg total) by mouth every 6 (six) hours as needed for nausea. 12/22/23   McElwee, Lauren A, NP  Vitamin D , Ergocalciferol , (DRISDOL ) 1.25 MG (50000 UNIT) CAPS capsule Take 1 capsule (50,000 Units total) by  mouth every 7 (seven) days. 12/25/23   McElwee, Tinnie LABOR, NP    Allergies: Patient has no known allergies.    Review of Systems  Cardiovascular:  Positive for palpitations.    Updated Vital Signs BP 112/83 (BP Location: Right Arm)   Pulse (!) 123   Temp 98.2 F (36.8 C) (Oral)   Resp 18   Ht 5' 4 (1.626 m)   Wt 64 kg   LMP 12/28/2023 (Approximate)   SpO2 98%   BMI 24.20 kg/m   Physical Exam  (all labs ordered are listed, but only abnormal results are displayed) Labs Reviewed  COMPREHENSIVE METABOLIC PANEL WITH GFR  CBC WITH DIFFERENTIAL/PLATELET  HCG, SERUM, QUALITATIVE  MAGNESIUM     EKG: None  Radiology: No results found.  {Document cardiac monitor, telemetry assessment procedure when appropriate:32947} Procedures   Medications Ordered in the ED  sodium chloride  0.9 % bolus 1,000 mL (has no administration in time range)      {Click here for ABCD2, HEART and other calculators REFRESH Note before signing:1}                              Medical Decision Making Amount and/or Complexity of Data Reviewed Labs: ordered. Radiology: ordered.   ***  {Document critical care time when appropriate  Document review of labs and clinical decision tools ie CHADS2VASC2, etc  Document your independent review of radiology images and any outside  records  Document your discussion with family members, caretakers and with consultants  Document social determinants of health affecting pt's care  Document your decision making why or why not admission, treatments were needed:32947:::1}   Final diagnoses:  None    ED Discharge Orders     None        "

## 2024-01-14 ENCOUNTER — Emergency Department (HOSPITAL_COMMUNITY)

## 2024-01-14 LAB — CBC WITH DIFFERENTIAL/PLATELET
Abs Immature Granulocytes: 0.04 K/uL (ref 0.00–0.07)
Basophils Absolute: 0.1 K/uL (ref 0.0–0.1)
Basophils Relative: 1 %
Eosinophils Absolute: 0.1 K/uL (ref 0.0–0.5)
Eosinophils Relative: 1 %
HCT: 39.4 % (ref 36.0–46.0)
Hemoglobin: 13.8 g/dL (ref 12.0–15.0)
Immature Granulocytes: 1 %
Lymphocytes Relative: 34 %
Lymphs Abs: 2.3 K/uL (ref 0.7–4.0)
MCH: 33.9 pg (ref 26.0–34.0)
MCHC: 35 g/dL (ref 30.0–36.0)
MCV: 96.8 fL (ref 80.0–100.0)
Monocytes Absolute: 0.6 K/uL (ref 0.1–1.0)
Monocytes Relative: 10 %
Neutro Abs: 3.6 K/uL (ref 1.7–7.7)
Neutrophils Relative %: 53 %
Platelets: 372 K/uL (ref 150–400)
RBC: 4.07 MIL/uL (ref 3.87–5.11)
RDW: 12.9 % (ref 11.5–15.5)
WBC: 6.7 K/uL (ref 4.0–10.5)
nRBC: 0 % (ref 0.0–0.2)

## 2024-01-14 LAB — COMPREHENSIVE METABOLIC PANEL WITH GFR
ALT: 34 U/L (ref 0–44)
AST: 55 U/L — ABNORMAL HIGH (ref 15–41)
Albumin: 4.1 g/dL (ref 3.5–5.0)
Alkaline Phosphatase: 54 U/L (ref 38–126)
Anion gap: 15 (ref 5–15)
BUN: <5 mg/dL — ABNORMAL LOW (ref 6–20)
CO2: 20 mmol/L — ABNORMAL LOW (ref 22–32)
Calcium: 8.5 mg/dL — ABNORMAL LOW (ref 8.9–10.3)
Chloride: 108 mmol/L (ref 98–111)
Creatinine, Ser: 0.56 mg/dL (ref 0.44–1.00)
GFR, Estimated: >60 mL/min
Glucose, Bld: 82 mg/dL (ref 70–99)
Potassium: 4 mmol/L (ref 3.5–5.1)
Sodium: 143 mmol/L (ref 135–145)
Total Bilirubin: 0.4 mg/dL (ref 0.0–1.2)
Total Protein: 6.8 g/dL (ref 6.5–8.1)

## 2024-01-14 LAB — MAGNESIUM: Magnesium: 2 mg/dL (ref 1.7–2.4)

## 2024-01-14 LAB — TROPONIN T, HIGH SENSITIVITY: Troponin T High Sensitivity: <15 ng/L (ref 0–19)

## 2024-01-14 LAB — HCG, SERUM, QUALITATIVE: Preg, Serum: NEGATIVE

## 2024-01-14 NOTE — ED Notes (Signed)
 Patient given discharge and follow-up instructions. Patient verbalized understanding. Patient left via private vehicle.

## 2024-01-20 ENCOUNTER — Telehealth: Admitting: Nurse Practitioner

## 2024-01-20 ENCOUNTER — Telehealth: Admitting: Family Medicine

## 2024-01-20 DIAGNOSIS — J040 Acute laryngitis: Secondary | ICD-10-CM | POA: Diagnosis not present

## 2024-01-20 DIAGNOSIS — R059 Cough, unspecified: Secondary | ICD-10-CM

## 2024-01-20 MED ORDER — PREDNISONE 20 MG PO TABS: 20.0000 mg | ORAL_TABLET | Freq: Every day | ORAL | 0 refills | Status: AC

## 2024-01-20 MED ORDER — PROMETHAZINE-DM 6.25-15 MG/5ML PO SYRP: 5.0000 mL | ORAL_SOLUTION | Freq: Four times a day (QID) | ORAL | 0 refills | Status: AC | PRN

## 2024-01-20 NOTE — Progress Notes (Signed)
" ° °  Thank you for the details you included in the comment boxes. Those details are very helpful in determining the best course of treatment for you and help us  to provide the best care.Because of the multiple symptoms, we recommend that you schedule a Virtual Urgent Care video visit in order for the provider to better assess what is going on.  The provider will be able to give you a more accurate diagnosis and treatment plan if we can more freely discuss your symptoms and with the addition of a virtual examination.   If you change your visit to a video visit, we will bill your insurance (similar to an office visit) and you will not be charged for this e-Visit. You will be able to stay at home and speak with the first available [facility] advanced practice provider. The link to do a video visit is in the drop down Menu tab of your Welcome screen in MyChart.    I have spent 5 minutes in review of e-visit questionnaire, review and updating patient chart, medical decision making and response to patient.   Roosvelt Mater, PA-C    "

## 2024-01-20 NOTE — Patient Instructions (Signed)
 " Ana Church, thank you for joining [name], NP for today's virtual visit.  While this provider is not your primary care provider (PCP), if your PCP is located in our provider database this encounter information will be shared with them immediately following your visit.   A [facility] MyChart account gives you access to today's visit and all your visits, tests, and labs performed at [facility]  click here if you don't have a [place] [facility] MyChart account or go to [email or web address]  Consent: (Patient) Ana Church provided verbal consent for this virtual visit at the beginning of the encounter.  Current Medications:  Current Outpatient Medications:    predniSONE  (DELTASONE ) 20 MG tablet, Take 1 tablet (20 mg total) by mouth daily with breakfast., Disp: 5 tablet, Rfl: 0   promethazine -dextromethorphan (PROMETHAZINE -DM) 6.25-15 MG/5ML syrup, Take 5 mLs by mouth 4 (four) times daily as needed., Disp: 240 mL, Rfl: 0   acetaminophen  (TYLENOL  8 HOUR) 650 MG CR tablet, Take 650-1,300 mg by mouth every 8 (eight) hours as needed for pain. (Patient not taking: Reported on 01/08/2024), Disp: , Rfl:    clindamycin-benzoyl peroxide (BENZACLIN) gel, Apply 1 Application topically daily at 12 noon., Disp: , Rfl:    escitalopram  (LEXAPRO ) 10 MG tablet, Take 1 tablet (10 mg total) by mouth daily., Disp: 90 tablet, Rfl: 3   hydrOXYzine  (ATARAX ) 25 MG tablet, Take 1 tablet (25 mg total) by mouth every 8 (eight) hours as needed for anxiety. (Patient not taking: Reported on 01/08/2024), Disp: 30 tablet, Rfl: 0   hydrOXYzine  (VISTARIL ) 25 MG capsule, Take 1 capsule (25 mg total) by mouth every 8 (eight) hours as needed., Disp: 30 capsule, Rfl: 0   ibuprofen  (ADVIL ) 800 MG tablet, Take 1 tablet (800 mg total) by mouth 3 (three) times daily with meals as needed for headache, moderate pain (pain score 4-6) or cramping. (Patient not taking: Reported on 01/08/2024), Disp: 60 tablet, Rfl: 0    Multiple Vitamin (MULTIVITAMIN PO), Take 1 tablet by mouth daily., Disp: , Rfl:    naproxen  (NAPROSYN ) 500 MG tablet, Take 1 tablet (500 mg total) by mouth 2 (two) times daily with a meal., Disp: 30 tablet, Rfl: 0   ondansetron  (ZOFRAN -ODT) 4 MG disintegrating tablet, Take 1 tablet (4 mg total) by mouth every 6 (six) hours as needed for nausea., Disp: 20 tablet, Rfl: 1   Vitamin D , Ergocalciferol , (DRISDOL ) 1.25 MG (50000 UNIT) CAPS capsule, Take 1 capsule (50,000 Units total) by mouth every 7 (seven) days., Disp: 12 capsule, Rfl: 0   Medications ordered in this encounter:  Meds ordered this encounter  Medications   promethazine -dextromethorphan (PROMETHAZINE -DM) 6.25-15 MG/5ML syrup    Sig: Take 5 mLs by mouth 4 (four) times daily as needed.    Dispense:  240 mL    Refill:  0    Supervising Provider:   BLAISE ALEENE KIDD [[id number]]   predniSONE  (DELTASONE ) 20 MG tablet    Sig: Take 1 tablet (20 mg total) by mouth daily with breakfast.    Dispense:  5 tablet    Refill:  0    Supervising Provider:   LAMPTEY, PHILIP O [[id number]]     *If you need refills on other medications prior to your next appointment, please contact your pharmacy*  Follow-Up: Call back or seek an in-person evaluation if the symptoms worsen or if the condition fails to improve as anticipated.  [facility] [phone]  Other Instructions Vocal rest, warm tea with honey  If you have been instructed to have an in-person evaluation today at a local Urgent Care facility, please use the link below. It will take you to a list of all of our available [facility], including address, phone number and hours of operation. Please do not delay care.  [facility]  If you or a family member do not have a primary care provider, use the link below to schedule a visit and establish care. When you choose a [place] primary care physician or advanced practice provider, you gain a long-term  partner in health. Find a Primary Care Provider  Learn more about [place]'s in-office and virtual care options:  - Get Care Now  "

## 2024-01-20 NOTE — Progress Notes (Signed)
 " Virtual Visit Consent   Ana Church, you are scheduled for a virtual visit with a [place] [facility] provider today. Just as with appointments in the office, your consent must be obtained to participate. Your consent will be active for this visit and any virtual visit you may have with one of our providers in the next 365 days. If you have a MyChart account, a copy of this consent can be sent to you electronically.  As this is a virtual visit, video technology does not allow for your provider to perform a traditional examination. This may limit your provider's ability to fully assess your condition. If your provider identifies any concerns that need to be evaluated in person or the need to arrange testing (such as labs, EKG, etc.), we will make arrangements to do so. Although advances in technology are sophisticated, we cannot ensure that it will always work on either your end or our end. If the connection with a video visit is poor, the visit may have to be switched to a telephone visit. With either a video or telephone visit, we are not always able to ensure that we have a secure connection.  By engaging in this virtual visit, you consent to the provision of healthcare and authorize for your insurance to be billed (if applicable) for the services provided during this visit. Depending on your insurance coverage, you may receive a charge related to this service.  I need to obtain your verbal consent now. Are you willing to proceed with your visit today? Ana Church has provided verbal consent on 01/20/2024 for a virtual visit (video or telephone). [name], NP  Date: 01/20/2024 4:00 PM   Virtual Visit via Video Note   I, [name], connected with  Ana Church  ([id number], [birth year]) on 01/20/2024 at  3:30 PM EST by a video-enabled telemedicine application and verified that I am speaking with the correct person using two identifiers.  Location: Patient: Virtual Visit Location Patient:  Home Provider: Virtual Visit Location Provider: Home Office   I discussed the limitations of evaluation and management by telemedicine and the availability of in person appointments. The patient expressed understanding and agreed to proceed.    History of Present Illness: Ana Church is a 24 y.o. who identifies as a female who was assigned female at birth, and is being seen today for vocal hoarseness.  Over the past 2 days Ana Church has been experiencing weakness, cough, sore throat and worsening laryngitis.   She has been taking DayQuil with little relief of her symptoms   Problems:  Patient Active Problem List   Diagnosis Date Noted   H/O bilateral salpingectomy 01/08/2024   Routine general medical examination at a health care facility 12/22/2023   Unwanted fertility 11/28/2023   Irregular menstrual bleeding 07/11/2023   Nausea 12/30/2022   Tachycardia 11/11/2022   Fatigue 11/11/2022   Vitamin D  insufficiency 11/11/2022   Elevated TSH 11/11/2022   Anxiety and depression 11/11/2022    Allergies: Allergies[1] Medications: Current Medications[2]  Observations/Objective: Patient is well-developed, well-nourished in no acute distress.  Resting comfortably at home.  Head is normocephalic, atraumatic.  No labored breathing.  Speech is clear and coherent with logical content.  Patient is alert and oriented at baseline.    Assessment and Plan: 1. Laryngitis (Primary) - promethazine -dextromethorphan (PROMETHAZINE -DM) 6.25-15 MG/5ML syrup; Take 5 mLs by mouth 4 (four) times daily as needed.  Dispense: 240 mL; Refill: 0 - predniSONE  (DELTASONE ) 20 MG tablet; Take 1 tablet (  20 mg total) by mouth daily with breakfast.  Dispense: 5 tablet; Refill: 0  Vocal rest, warm tea with honey  Follow Up Instructions: I discussed the assessment and treatment plan with the patient. The patient was provided an opportunity to ask questions and all were answered. The patient agreed with the plan  and demonstrated an understanding of the instructions.  A copy of instructions were sent to the patient via MyChart unless otherwise noted below.     The patient was advised to call back or seek an in-person evaluation if the symptoms worsen or if the condition fails to improve as anticipated.    [name], NP     [1] No Known Allergies [2]  Current Outpatient Medications:    predniSONE  (DELTASONE ) 20 MG tablet, Take 1 tablet (20 mg total) by mouth daily with breakfast., Disp: 5 tablet, Rfl: 0   promethazine -dextromethorphan (PROMETHAZINE -DM) 6.25-15 MG/5ML syrup, Take 5 mLs by mouth 4 (four) times daily as needed., Disp: 240 mL, Rfl: 0   acetaminophen  (TYLENOL  8 HOUR) 650 MG CR tablet, Take 650-1,300 mg by mouth every 8 (eight) hours as needed for pain. (Patient not taking: Reported on 01/08/2024), Disp: , Rfl:    clindamycin-benzoyl peroxide (BENZACLIN) gel, Apply 1 Application topically daily at 12 noon., Disp: , Rfl:    escitalopram  (LEXAPRO ) 10 MG tablet, Take 1 tablet (10 mg total) by mouth daily., Disp: 90 tablet, Rfl: 3   hydrOXYzine  (ATARAX ) 25 MG tablet, Take 1 tablet (25 mg total) by mouth every 8 (eight) hours as needed for anxiety. (Patient not taking: Reported on 01/08/2024), Disp: 30 tablet, Rfl: 0   hydrOXYzine  (VISTARIL ) 25 MG capsule, Take 1 capsule (25 mg total) by mouth every 8 (eight) hours as needed., Disp: 30 capsule, Rfl: 0   ibuprofen  (ADVIL ) 800 MG tablet, Take 1 tablet (800 mg total) by mouth 3 (three) times daily with meals as needed for headache, moderate pain (pain score 4-6) or cramping. (Patient not taking: Reported on 01/08/2024), Disp: 60 tablet, Rfl: 0   Multiple Vitamin (MULTIVITAMIN PO), Take 1 tablet by mouth daily., Disp: , Rfl:    naproxen  (NAPROSYN ) 500 MG tablet, Take 1 tablet (500 mg total) by mouth 2 (two) times daily with a meal., Disp: 30 tablet, Rfl: 0   ondansetron  (ZOFRAN -ODT) 4 MG disintegrating tablet, Take 1 tablet (4 mg total) by mouth  every 6 (six) hours as needed for nausea., Disp: 20 tablet, Rfl: 1   Vitamin D , Ergocalciferol , (DRISDOL ) 1.25 MG (50000 UNIT) CAPS capsule, Take 1 capsule (50,000 Units total) by mouth every 7 (seven) days., Disp: 12 capsule, Rfl: 0  "

## 2024-03-26 ENCOUNTER — Ambulatory Visit: Admitting: Internal Medicine
